# Patient Record
Sex: Female | Born: 1985
Health system: Southern US, Community
[De-identification: ages and names within clinical notes are randomized; demographics above are authoritative.]

## PROBLEM LIST (undated history)

## (undated) ENCOUNTER — Inpatient Hospital Stay (HOSPITAL_COMMUNITY): Payer: Self-pay

## (undated) DIAGNOSIS — E119 Type 2 diabetes mellitus without complications: Secondary | ICD-10-CM

## (undated) DIAGNOSIS — O24419 Gestational diabetes mellitus in pregnancy, unspecified control: Secondary | ICD-10-CM

## (undated) HISTORY — PX: TONSILLECTOMY: SUR1361

---

## 2015-10-26 ENCOUNTER — Ambulatory Visit (INDEPENDENT_AMBULATORY_CARE_PROVIDER_SITE_OTHER): Payer: Self-pay

## 2015-10-26 ENCOUNTER — Encounter: Payer: Self-pay | Admitting: Family Medicine

## 2015-10-26 ENCOUNTER — Inpatient Hospital Stay (HOSPITAL_COMMUNITY)
Admission: AD | Admit: 2015-10-26 | Discharge: 2015-10-26 | Disposition: A | Discharge status: Home / Self Care | Payer: Medicaid Other | Source: Ambulatory Visit | Attending: Family Medicine | Admitting: Family Medicine

## 2015-10-26 DIAGNOSIS — N912 Amenorrhea, unspecified: Secondary | ICD-10-CM | POA: Insufficient documentation

## 2015-10-26 DIAGNOSIS — Z3201 Encounter for pregnancy test, result positive: Secondary | ICD-10-CM

## 2015-10-26 DIAGNOSIS — Z3200 Encounter for pregnancy test, result unknown: Secondary | ICD-10-CM

## 2015-10-26 LAB — POCT PREGNANCY, URINE: Preg Test, Ur: POSITIVE — AB

## 2015-10-26 NOTE — Discharge Instructions (Signed)
Secondary Amenorrhea Secondary amenorrhea is the stopping of menstrual flow for 3-6 months in a female who has previously had periods. There are many possible causes. Most of these causes are not serious. Usually, treating the underlying problem causing the loss of menses will return your periods to normal. CAUSES  Some common and uncommon causes of not menstruating include:  Malnutrition.  Low blood sugar (hypoglycemia).  Polycystic ovary disease.  Stress or fear.  Breastfeeding.  Hormone imbalance.  Ovarian failure.  Medicines.  Extreme obesity.  Cystic fibrosis.  Low body weight or drastic weight reduction from any cause.  Early menopause.  Removal of ovaries or uterus.  Contraceptives.  Illness.  Long-term (chronic) illnesses.  Cushing syndrome.  Thyroid problems.  Birth control pills, patches, or vaginal rings for birth control. RISK FACTORS You may be at greater risk of secondary amenorrhea if:  You have a family history of this condition.  You have an eating disorder.  You do athletic training. DIAGNOSIS  A diagnosis is made by your health care provider taking a medical history and doing a physical exam. This will include a pelvic exam to check for problems with your reproductive organs. Pregnancy must be ruled out. Often, numerous blood tests are done to measure different hormones in the body. Urine testing may be done. Specialized exams (ultrasound, CT scan, MRI, or hysteroscopy) may have to be done as well as measuring the body mass index (BMI). TREATMENT  Treatment depends on the cause of the amenorrhea. If an eating disorder is present, this can be treated with an adequate diet and therapy. Chronic illnesses may improve with treatment of the illness. Amenorrhea may be corrected with medicines, lifestyle changes, or surgery. If the amenorrhea cannot be corrected, it is sometimes possible to create a false menstruation with medicines. HOME CARE  INSTRUCTIONS  Maintain a healthy diet.  Manage weight problems.  Exercise regularly but not excessively.  Get adequate sleep.  Manage stress.  Be aware of changes in your menstrual cycle. Keep a record of when your periods occur. Note the date your period starts, how long it lasts, and any problems. SEEK MEDICAL CARE IF: Your symptoms do not get better with treatment.   This information is not intended to replace advice given to you by your health care provider. Make sure you discuss any questions you have with your health care provider.   Document Released: 06/11/2006 Document Revised: 05/21/2014 Document Reviewed: 10/16/2012 Elsevier Interactive Patient Education 2016 Elsevier Inc.  

## 2015-10-26 NOTE — Progress Notes (Signed)
Pt presented to clinic today for pregnancy test. Test confirms she is pregnant around 9 weeks. Pt wishes to start prenatal care here at the clinic.

## 2015-10-26 NOTE — MAU Note (Signed)
Have last period the 10th of April.  Had + home test end of May.  Wants blood test.

## 2015-10-26 NOTE — MAU Provider Note (Signed)
[name] is a 30 y.o. fmale who presents to MAU today for pregnancy verification. The patient denies abdominal pain or vaginal bleeding today. LMP 08/22/15  BP 128/83 mmHg  Pulse 88  Temp(Src) 98.2 F (36.8 C) (Oral)  Resp 16  Wt 274 lb 12.8 oz (124.648 kg)  LMP 10/22/2015  CONSTITUTIONAL: Well-developed, well-nourished female in no acute distress.  CARDIOVASCULAR: Regular heart rate RESPIRATORY: Normal effort NEUROLOGICAL: Alert and oriented to person, place, and time.  SKIN: Skin is warm and dry. No rash noted. Not diaphoretic. No erythema. No pallor. PSYCH: Normal mood and affect. Normal behavior. Normal judgment and thought content.  MDM Medical Screen Exam Complete  A: Amenorrhea  P: Discharge from MAU Patient advised to follow-up with [facility] for pregnancy confirmation  Monday-Thursday 8am-4pm or Friday 8am-11am Patient may return to MAU as needed or if her condition were to change or worsen   Marny LowensteinJulie N Ronya Gilcrest, PA-C  10/26/2015 2:12 PM

## 2015-11-23 ENCOUNTER — Ambulatory Visit (INDEPENDENT_AMBULATORY_CARE_PROVIDER_SITE_OTHER): Payer: Medicaid Other | Admitting: Obstetrics & Gynecology

## 2015-11-23 ENCOUNTER — Encounter: Payer: Self-pay | Admitting: Obstetrics & Gynecology

## 2015-11-23 VITALS — BP 125/60 | HR 92 | Ht 68.0 in | Wt 273.2 lb

## 2015-11-23 DIAGNOSIS — Z124 Encounter for screening for malignant neoplasm of cervix: Secondary | ICD-10-CM | POA: Diagnosis not present

## 2015-11-23 DIAGNOSIS — O99210 Obesity complicating pregnancy, unspecified trimester: Secondary | ICD-10-CM

## 2015-11-23 DIAGNOSIS — O99211 Obesity complicating pregnancy, first trimester: Secondary | ICD-10-CM | POA: Diagnosis not present

## 2015-11-23 DIAGNOSIS — Z1151 Encounter for screening for human papillomavirus (HPV): Secondary | ICD-10-CM

## 2015-11-23 DIAGNOSIS — Z113 Encounter for screening for infections with a predominantly sexual mode of transmission: Secondary | ICD-10-CM | POA: Diagnosis not present

## 2015-11-23 DIAGNOSIS — Z3401 Encounter for supervision of normal first pregnancy, first trimester: Secondary | ICD-10-CM | POA: Diagnosis not present

## 2015-11-23 DIAGNOSIS — E669 Obesity, unspecified: Secondary | ICD-10-CM | POA: Diagnosis not present

## 2015-11-23 DIAGNOSIS — O0993 Supervision of high risk pregnancy, unspecified, third trimester: Secondary | ICD-10-CM | POA: Insufficient documentation

## 2015-11-23 LAB — POCT URINALYSIS DIP (DEVICE)
BILIRUBIN URINE: NEGATIVE
Glucose, UA: NEGATIVE mg/dL
Hgb urine dipstick: NEGATIVE
Ketones, ur: NEGATIVE mg/dL
LEUKOCYTES UA: NEGATIVE
NITRITE: NEGATIVE
PH: 5.5 (ref 5.0–8.0)
Protein, ur: NEGATIVE mg/dL
Specific Gravity, Urine: >=1.03 (ref 1.005–1.030)
Urobilinogen, UA: 0.2 mg/dL (ref 0.0–1.0)

## 2015-11-23 LAB — OB RESULTS CONSOLE GC/CHLAMYDIA: Gonorrhea: POSITIVE

## 2015-11-23 NOTE — Progress Notes (Signed)
   Subjective:    Cheryl Boyle is a 30 yo S [place] G1P0 6155w2d being seen today for her first obstetrical visit.  Her obstetrical history is significant for obesity. Patient does intend to breast feed. Pregnancy history fully reviewed.  Patient reports no complaints.  Filed Vitals:   11/23/15 1250 11/23/15 1251  BP: 125/60   Pulse: 92   Height:  5\' 8"  (1.727 m)  Weight: 273 lb 3.2 oz (123.923 kg)     HISTORY: OB History  Gravida Para Term Preterm AB SAB TAB Ectopic Multiple Living  1             # Outcome Date GA Lbr Len/2nd Weight Sex Delivery Anes PTL Lv  1 Current              History reviewed. No pertinent past medical history. History reviewed. No pertinent past surgical history. Family History  Problem Relation Age of Onset  . Diabetes Mother   . Heart disease Mother      Exam    Uterus:     Pelvic Exam:    Perineum: No Hemorrhoids   Vulva: normal   Vagina:  normal mucosa   pH:    Cervix: anteverted   Adnexa: no mass, fullness, tenderness   Bony Pelvis: android  System: Breast:  normal appearance, no masses or tenderness   Skin: normal coloration and turgor, no rashes    Neurologic: oriented   Extremities: normal strength, tone, and muscle mass   HEENT PERRLA   Mouth/Teeth mucous membranes moist, pharynx normal without lesions   Neck supple   Cardiovascular: regular rate and rhythm   Respiratory:  appears well, vitals normal, no respiratory distress, acyanotic, normal RR, ear and throat exam is normal, neck free of mass or lymphadenopathy, chest clear, no wheezing, crepitations, rhonchi, normal symmetric air entry   Abdomen: soft, non-tender; bowel sounds normal; no masses,  no organomegaly   Urinary: urethral meatus normal      Assessment:    Pregnancy: G1P0 Patient Active Problem List   Diagnosis Date Noted  . Obesity in pregnancy 11/23/2015        Plan:     Initial labs drawn. Prenatal vitamins. Problem list reviewed and  updated. Genetic Screening discussed Quad Screen:desired  Ultrasound discussed; fetal survey: ordered.  Follow up in 4 weeks. Discussed rec'd weight gain   Cheryl Boyle 11/23/2015

## 2015-11-24 ENCOUNTER — Encounter: Payer: Self-pay | Admitting: Obstetrics and Gynecology

## 2015-11-24 LAB — PRENATAL PROFILE (SOLSTAS)
ANTIBODY SCREEN: NEGATIVE
BASOS PCT: 0 %
Basophils Absolute: 0 cells/uL (ref 0–200)
EOS ABS: 152 {cells}/uL (ref 15–500)
EOS PCT: 2 %
HEMATOCRIT: 39.4 % (ref 35.0–45.0)
HEMOGLOBIN: 12.8 g/dL (ref 11.7–15.5)
HIV 1&2 Ab, 4th Generation: NONREACTIVE
Hepatitis B Surface Ag: NEGATIVE
Lymphocytes Relative: 27 %
Lymphs Abs: 2052 cells/uL (ref 850–3900)
MCH: 26.9 pg — AB (ref 27.0–33.0)
MCHC: 32.5 g/dL (ref 32.0–36.0)
MCV: 82.8 fL (ref 80.0–100.0)
MONOS PCT: 5 %
MPV: 10.4 fL (ref 7.5–12.5)
Monocytes Absolute: 380 cells/uL (ref 200–950)
NEUTROS ABS: 5016 {cells}/uL (ref 1500–7800)
Neutrophils Relative %: 66 %
Platelets: 265 10*3/uL (ref 140–400)
RBC: 4.76 MIL/uL (ref 3.80–5.10)
RDW: 15 % (ref 11.0–15.0)
Rh Type: POSITIVE
Rubella: 2.55 Index — ABNORMAL HIGH (ref <0.90)
WBC: 7.6 10*3/uL (ref 3.8–10.8)

## 2015-11-24 LAB — GC/CHLAMYDIA PROBE AMP (~~LOC~~) NOT AT ARMC
Chlamydia: NEGATIVE
NEISSERIA GONORRHEA: POSITIVE — AB

## 2015-11-24 LAB — GLUCOSE TOLERANCE, 1 HOUR (50G) W/O FASTING: Glucose, 1 Hr, gestational: 175 mg/dL — ABNORMAL HIGH (ref <140)

## 2015-11-24 LAB — CYTOLOGY - PAP

## 2015-11-25 ENCOUNTER — Inpatient Hospital Stay (HOSPITAL_COMMUNITY)
Admission: AD | Admit: 2015-11-25 | Discharge: 2015-11-25 | Disposition: A | Discharge status: Home / Self Care | Payer: Medicaid Other | Source: Ambulatory Visit | Attending: Obstetrics & Gynecology | Admitting: Obstetrics & Gynecology

## 2015-11-25 ENCOUNTER — Telehealth: Payer: Self-pay

## 2015-11-25 ENCOUNTER — Ambulatory Visit: Payer: Self-pay

## 2015-11-25 ENCOUNTER — Encounter: Payer: Self-pay | Admitting: Obstetrics & Gynecology

## 2015-11-25 DIAGNOSIS — Z3A13 13 weeks gestation of pregnancy: Secondary | ICD-10-CM | POA: Insufficient documentation

## 2015-11-25 DIAGNOSIS — O98213 Gonorrhea complicating pregnancy, third trimester: Secondary | ICD-10-CM | POA: Insufficient documentation

## 2015-11-25 DIAGNOSIS — O98211 Gonorrhea complicating pregnancy, first trimester: Secondary | ICD-10-CM | POA: Diagnosis present

## 2015-11-25 DIAGNOSIS — O24919 Unspecified diabetes mellitus in pregnancy, unspecified trimester: Secondary | ICD-10-CM | POA: Insufficient documentation

## 2015-11-25 LAB — PAIN MGMT, PROFILE 6 CONF W/O MM, U
6 ACETYLMORPHINE: NEGATIVE ng/mL (ref <10)
ALCOHOL METABOLITES: NEGATIVE ng/mL (ref <500)
Amphetamines: NEGATIVE ng/mL (ref <500)
BARBITURATES: NEGATIVE ng/mL (ref <300)
Benzodiazepines: NEGATIVE ng/mL (ref <100)
Cocaine Metabolite: NEGATIVE ng/mL (ref <150)
Creatinine: 212.4 mg/dL (ref >=20.0)
MARIJUANA METABOLITE: NEGATIVE ng/mL (ref <20)
METHADONE METABOLITE: NEGATIVE ng/mL (ref <100)
OXYCODONE: NEGATIVE ng/mL (ref <100)
Opiates: NEGATIVE ng/mL (ref <100)
Oxidant: NEGATIVE ug/mL (ref <200)
PHENCYCLIDINE: NEGATIVE ng/mL (ref <25)
Please note:: 0
pH: 5.8 (ref 4.5–9.0)

## 2015-11-25 LAB — HEMOGLOBINOPATHY EVALUATION
HCT: 39.4 % (ref 35.0–45.0)
HGB A2 QUANT: 2.2 % (ref 1.8–3.5)
Hemoglobin: 12.8 g/dL (ref 11.7–15.5)
Hgb A: 96.8 % (ref >96.0)
MCH: 26.9 pg — ABNORMAL LOW (ref 27.0–33.0)
MCV: 82.8 fL (ref 80.0–100.0)
RBC: 4.76 MIL/uL (ref 3.80–5.10)
RDW: 15 % (ref 11.0–15.0)

## 2015-11-25 LAB — CULTURE, OB URINE
Colony Count: NO GROWTH
Organism ID, Bacteria: NO GROWTH

## 2015-11-25 MED ORDER — CEFTRIAXONE SODIUM 250 MG IJ SOLR
250.0000 mg | Freq: Once | INTRAMUSCULAR | Status: AC
  Administered 2015-11-25: 250 mg via INTRAMUSCULAR
  Filled 2015-11-25: qty 250

## 2015-11-25 MED ORDER — AZITHROMYCIN 250 MG PO TABS
1000.0000 mg | ORAL_TABLET | Freq: Once | ORAL | Status: AC
  Administered 2015-11-25: 1000 mg via ORAL
  Filled 2015-11-25: qty 4

## 2015-11-25 MED ORDER — AZITHROMYCIN 250 MG PO TABS: 500.0000 mg | ORAL_TABLET | Freq: Once | ORAL | Status: DC

## 2015-11-25 NOTE — MAU Note (Signed)
Was called and instructed to go to clinic for treatment for gonorrhea. Clinic is now closed.  Spoke with provider, will treat pt. Pt has questions regarding infection.  Dr Omer JackMumaw in with pt.

## 2015-11-25 NOTE — MAU Provider Note (Signed)
30 y/o G1P0 at 13.4 weeks, here for +gonorrhea test. Patient came in to MAU due to a +gonorrhea that was recently tested in clinic, but patient was unable to make it to clinic before closing, needing treatment. Patient brought in partner, Zouheir Hamze DOB 06/23/1973, who agreed to be treated via expedited partner therapy (information given to partner), no known drug allergies. Questions were answered to patient and partner's satisfaction. It was recommended that both partners do not resume intercourse for at least 1 week after treatment for both, but highly recommended for abstinence for at least 4-6 weeks until treatment of cure can be performed on both.   Jen MowElizabeth Zerenity Bowron, DO, [place] Fellow

## 2015-11-25 NOTE — Telephone Encounter (Signed)
Error

## 2015-11-25 NOTE — Telephone Encounter (Signed)
Patient tested positive for gonorrhea infection.Patient is aware and will come into clinic today for treatment. Patient advised to tell all partners and to have no sex for least 2 weeks. STD card completed and fax to health department.

## 2015-11-25 NOTE — MAU Note (Signed)
Partner is here, rx for treatment given. Advised to abstain from sexual relations for minimum of 1 wk, would prefer 4 wks to recheck.

## 2015-11-28 ENCOUNTER — Encounter: Payer: Self-pay | Admitting: Obstetrics and Gynecology

## 2015-11-28 ENCOUNTER — Telehealth: Payer: Self-pay

## 2015-11-28 NOTE — Telephone Encounter (Signed)
She will need a 3 hour GTT soon please.   I have left a message for patient to call us back regarding lab results.

## 2015-11-29 NOTE — Telephone Encounter (Signed)
Patient has been scheduled tomorrow for 3 hour gtt

## 2015-12-02 ENCOUNTER — Other Ambulatory Visit: Payer: Self-pay

## 2015-12-02 DIAGNOSIS — O99810 Abnormal glucose complicating pregnancy: Secondary | ICD-10-CM

## 2015-12-03 ENCOUNTER — Encounter: Payer: Self-pay | Admitting: Obstetrics & Gynecology

## 2015-12-03 LAB — GLUCOSE TOLERANCE, 3 HOURS
GLUCOSE 3 HOUR GTT: 180 mg/dL — AB (ref <145)
GLUCOSE, 1 HOUR-GESTATIONAL: 236 mg/dL — AB (ref <190)
GLUCOSE, 2 HOUR-GESTATIONAL: 206 mg/dL — AB (ref <165)
GLUCOSE, FASTING-GESTATIONAL: 124 mg/dL — AB (ref 65–104)

## 2015-12-08 ENCOUNTER — Telehealth: Payer: Self-pay | Admitting: General Practice

## 2015-12-08 NOTE — Telephone Encounter (Signed)
Per Dr Macon Large, patient's 3 hr gtt was elevated. Patient probably had diabetes before pregnancy. Patient needs to come in for diabetes education & for supplies. Called patient with [employer] [phone], no answer- left message to call us back at the office for results

## 2015-12-14 NOTE — Telephone Encounter (Signed)
I called Cheryl Boyle with [facility] interpreter [phone].  I notified her that her 3 hr gtt was elevated which means she probably had diabetes before pregnancy. I offered her appt to come into see Maggie , nurse educator on 12/19/15 at 0830 which she agreed to.

## 2015-12-19 ENCOUNTER — Ambulatory Visit: Payer: Medicaid Other | Admitting: *Deleted

## 2015-12-19 ENCOUNTER — Encounter: Payer: Medicaid Other | Attending: Obstetrics and Gynecology | Admitting: Dietician

## 2015-12-19 DIAGNOSIS — O24419 Gestational diabetes mellitus in pregnancy, unspecified control: Secondary | ICD-10-CM

## 2015-12-19 DIAGNOSIS — Z713 Dietary counseling and surveillance: Secondary | ICD-10-CM | POA: Insufficient documentation

## 2015-12-19 NOTE — Progress Notes (Signed)
Diabetes Education: 12/19/15 Cheryl Boyle is a 30 year old G1P0 at 2317 weeks. Her mother has type 2 DM. Speaks [place] and [place]. Agreed to do education session in English spoken slowly.  During session questioned and ask for clarification throughout. Completed review of GDM and its implications for baby and mother.   Review of post-delivery self-care measures to assist in preventing the onset of type 2 DM. Review of factors that increase/decrease blood glucose. Encouraged her to walk daily for 30 minutes in the cooler part of the day. Review of blood glucose monitoring procedure.  Currently has no insurance and has just applied for [place] Medicaid.  Reports that they informed her that they wold call her in 3 weeks to let her know of the status of her application.  I took the liberty of providing a True Track meter and strips and lancets to get her covered over the next 3-4 weeks as she awaits Medicaid. Meter: lot: [id number]  EXP: 2017/08/25 Strips: LOT: [id number]  EXP 2017/09/10 Lancets: LOT [id number] NM EXP 2020/01/17 Instructed to monitor fasting and 2 hours after first bite of each meal.  Glucose goals noted. On return demonstration of self-monitoring, her glucose post-breakfast was 150 mg/dl. Instructed to record glucose levels and to bring meter and glucose log to all clinic appointments. Completed review of the carb restricted diet for GDM and the recommended meal and snack patterns. Review based on foods she typically will eat. Provided handout "Nutrition, Diabetes and Pregnancy" in [place] and [place]. Will follow as needed. Maggie Moses Odoherty, RN, RD, LDN

## 2015-12-26 ENCOUNTER — Ambulatory Visit (INDEPENDENT_AMBULATORY_CARE_PROVIDER_SITE_OTHER): Payer: Medicaid Other | Admitting: Obstetrics and Gynecology

## 2015-12-26 VITALS — BP 124/68 | HR 94 | Wt 276.0 lb

## 2015-12-26 DIAGNOSIS — O98212 Gonorrhea complicating pregnancy, second trimester: Secondary | ICD-10-CM | POA: Diagnosis not present

## 2015-12-26 DIAGNOSIS — O99212 Obesity complicating pregnancy, second trimester: Secondary | ICD-10-CM | POA: Diagnosis not present

## 2015-12-26 DIAGNOSIS — O24312 Unspecified pre-existing diabetes mellitus in pregnancy, second trimester: Secondary | ICD-10-CM | POA: Diagnosis present

## 2015-12-26 DIAGNOSIS — E669 Obesity, unspecified: Secondary | ICD-10-CM

## 2015-12-26 DIAGNOSIS — O0992 Supervision of high risk pregnancy, unspecified, second trimester: Secondary | ICD-10-CM

## 2015-12-26 DIAGNOSIS — O24912 Unspecified diabetes mellitus in pregnancy, second trimester: Secondary | ICD-10-CM

## 2015-12-26 DIAGNOSIS — O99210 Obesity complicating pregnancy, unspecified trimester: Secondary | ICD-10-CM

## 2015-12-26 DIAGNOSIS — O98219 Gonorrhea complicating pregnancy, unspecified trimester: Secondary | ICD-10-CM

## 2015-12-26 LAB — POCT URINALYSIS DIP (DEVICE)
Bilirubin Urine: NEGATIVE
GLUCOSE, UA: NEGATIVE mg/dL
Hgb urine dipstick: NEGATIVE
KETONES UR: NEGATIVE mg/dL
Nitrite: NEGATIVE
PROTEIN: 30 mg/dL — AB
Urobilinogen, UA: 1 mg/dL (ref 0.0–1.0)
pH: 6 (ref 5.0–8.0)

## 2015-12-26 NOTE — Progress Notes (Signed)
Subjective:  Cheryl Boyle is a 30 y.o. G1P0 at 5648w0d being seen today for ongoing prenatal care.  She is currently monitored for the following issues for this high-risk pregnancy and has Obesity in pregnancy; Supervision of high risk pregnancy, antepartum; Gonorrhea affecting pregnancy; and Diabetes mellitus during pregnancy, antepartum on her problem list.  Patient reports no complaints.  Contractions: Not present. Vag. Bleeding: None.  Movement: Absent. Denies leaking of fluid.   The following portions of the patient's history were reviewed and updated as appropriate: allergies, current medications, past family history, past medical history, past social history, past surgical history and problem list. Problem list updated.  Objective:   Vitals:   12/26/15 0850  BP: 124/68  Pulse: 94  Weight: 276 lb (125.2 kg)    Fetal Status: Fetal Heart Rate (bpm): 156   Movement: Absent     General:  Alert, oriented and cooperative. Patient is in no acute distress.  Skin: Skin is warm and dry. No rash noted.   Cardiovascular: Normal heart rate noted  Respiratory: Normal respiratory effort, no problems with respiration noted  Abdomen: Soft, gravid, appropriate for gestational age. Pain/Pressure: Present     Pelvic:  Cervical exam deferred        Extremities: Normal range of motion.  Edema: None  Mental Status: Normal mood and affect. Normal behavior. Normal judgment and thought content.   Urinalysis: Urine Protein: 1+ Urine Glucose: Negative  Assessment and Plan:  Pregnancy: G1P0 at 5548w0d  1. Diabetes mellitus affecting pregnancy in second trimester Patient did not bring her CBG log. She reports fasting are in the low 100 and pp in 130's Advised to monitor and adjust diet. Advised to increase exercise activity Stressed the importance of brining meter and log book in order to assist her in optimizing the health of her pregnancy - AFP, Quad Screen  2. Supervision of high risk pregnancy,  antepartum, second trimester Quad screen today Anatomy ultrasound scheduled for next week   5. Diabetes mellitus during pregnancy, antepartum, second trimester   General obstetric precautions including but not limited to vaginal bleeding, contractions, leaking of fluid and fetal movement were reviewed in detail with the patient. Please refer to After Visit Summary for other counseling recommendations.  Return in about 2 weeks (around 01/09/2016).   Catalina AntiguaPeggy Darrall Strey, MD

## 2015-12-27 LAB — AFP, QUAD SCREEN
AFP: 24.6 ng/mL
Age Alone: 1:496 {titer}
CURR GEST AGE: 18 wk
Down Syndrome Scr Risk Est: 1:8970 {titer}
HCG, Total: 9.08 IU/mL
INH: 104.8 pg/mL
INTERPRETATION-AFP: NEGATIVE
MOM FOR HCG: 0.46
MOM FOR INH: 0.86
MoM for AFP: 0.79
Open Spina bifida: NEGATIVE
Osb Risk: <1:54600 {titer}
Tri 18 Scr Risk Est: NEGATIVE
uE3 Mom: 1.32
uE3 Value: 1.44 ng/mL

## 2016-01-02 ENCOUNTER — Encounter: Payer: Self-pay | Admitting: Obstetrics and Gynecology

## 2016-01-10 ENCOUNTER — Other Ambulatory Visit: Payer: Self-pay | Admitting: Obstetrics & Gynecology

## 2016-01-10 ENCOUNTER — Ambulatory Visit (HOSPITAL_COMMUNITY)
Admission: RE | Admit: 2016-01-10 | Discharge: 2016-01-10 | Disposition: A | Discharge status: Home / Self Care | Payer: Medicaid Other | Source: Ambulatory Visit | Attending: Obstetrics & Gynecology | Admitting: Obstetrics & Gynecology

## 2016-01-10 DIAGNOSIS — Z3A20 20 weeks gestation of pregnancy: Secondary | ICD-10-CM

## 2016-01-10 DIAGNOSIS — Z36 Encounter for antenatal screening of mother: Secondary | ICD-10-CM | POA: Diagnosis present

## 2016-01-10 DIAGNOSIS — Z1389 Encounter for screening for other disorder: Secondary | ICD-10-CM

## 2016-01-10 DIAGNOSIS — Z3401 Encounter for supervision of normal first pregnancy, first trimester: Secondary | ICD-10-CM

## 2016-01-20 ENCOUNTER — Telehealth: Payer: Self-pay | Admitting: General Practice

## 2016-01-20 NOTE — Telephone Encounter (Signed)
Patient called and left message stating she had an ultrasound done on the 29th and she would like her results. Called patient, no answer- left message stating we are trying to reach to return your phone call, please call us back at the clinics. Per chart review, patient needs OB appt and hasn't been seen in a couple weeks.  

## 2016-01-20 NOTE — Telephone Encounter (Signed)
Patient called and left message stating she had an ultrasound done on the 29th and she would like her results. Called patient, no answer- left message stating we are trying to reach to return your phone call, please call us back at the clinics. Per chart review, patient needs OB appt and hasn't been seen in a couple weeks.

## 2016-01-23 ENCOUNTER — Encounter: Payer: Medicaid Other | Admitting: Obstetrics and Gynecology

## 2016-01-23 NOTE — Telephone Encounter (Signed)
Patient has appointment this am will address her concerns at this time.

## 2016-02-28 ENCOUNTER — Telehealth: Payer: Self-pay | Admitting: Obstetrics & Gynecology

## 2016-02-28 NOTE — Telephone Encounter (Signed)
Called patient with [employer] to try to reschedule appointment. She was not home, so we left a message to call us back to reschedule.

## 2016-03-19 ENCOUNTER — Telehealth: Payer: Self-pay | Admitting: General Practice

## 2016-03-19 NOTE — Telephone Encounter (Signed)
Patient called and left message stating she hasn't seen the doctor in a while and would like her u/s results. Called patient and informed her of normal u/s results but need for follow up u/s for growth which we will schedule at her appt this week on 11/8. Patient verbalized understanding & had no questions

## 2016-03-21 ENCOUNTER — Ambulatory Visit (INDEPENDENT_AMBULATORY_CARE_PROVIDER_SITE_OTHER): Payer: Medicaid Other | Admitting: Family

## 2016-03-21 ENCOUNTER — Other Ambulatory Visit (HOSPITAL_COMMUNITY)
Admission: RE | Admit: 2016-03-21 | Discharge: 2016-03-21 | Disposition: A | Discharge status: Home / Self Care | Payer: Medicaid Other | Source: Ambulatory Visit | Attending: Family | Admitting: Family

## 2016-03-21 VITALS — BP 118/73 | HR 118 | Wt 278.3 lb

## 2016-03-21 DIAGNOSIS — Z113 Encounter for screening for infections with a predominantly sexual mode of transmission: Secondary | ICD-10-CM | POA: Insufficient documentation

## 2016-03-21 DIAGNOSIS — O98213 Gonorrhea complicating pregnancy, third trimester: Secondary | ICD-10-CM

## 2016-03-21 DIAGNOSIS — O24119 Pre-existing diabetes mellitus, type 2, in pregnancy, unspecified trimester: Secondary | ICD-10-CM

## 2016-03-21 DIAGNOSIS — O0990 Supervision of high risk pregnancy, unspecified, unspecified trimester: Secondary | ICD-10-CM

## 2016-03-21 DIAGNOSIS — Z23 Encounter for immunization: Secondary | ICD-10-CM

## 2016-03-21 DIAGNOSIS — O24913 Unspecified diabetes mellitus in pregnancy, third trimester: Secondary | ICD-10-CM | POA: Diagnosis present

## 2016-03-21 DIAGNOSIS — O0993 Supervision of high risk pregnancy, unspecified, third trimester: Secondary | ICD-10-CM

## 2016-03-21 LAB — POCT URINALYSIS DIP (DEVICE)
GLUCOSE, UA: NEGATIVE mg/dL
Glucose, UA: NEGATIVE mg/dL
Hgb urine dipstick: NEGATIVE
Hgb urine dipstick: NEGATIVE
KETONES UR: NEGATIVE mg/dL
Nitrite: NEGATIVE
Nitrite: NEGATIVE
PROTEIN: NEGATIVE mg/dL
Protein, ur: NEGATIVE mg/dL
SPECIFIC GRAVITY, URINE: 1.025 (ref 1.005–1.030)
Specific Gravity, Urine: 1.025 (ref 1.005–1.030)
UROBILINOGEN UA: 0.2 mg/dL (ref 0.0–1.0)
Urobilinogen, UA: 0.2 mg/dL (ref 0.0–1.0)
pH: 5.5 (ref 5.0–8.0)
pH: 5.5 (ref 5.0–8.0)

## 2016-03-21 LAB — COMPREHENSIVE METABOLIC PANEL
ALBUMIN: 3.4 g/dL — AB (ref 3.6–5.1)
ALT: 8 U/L (ref 6–29)
AST: 11 U/L (ref 10–30)
Alkaline Phosphatase: 114 U/L (ref 33–115)
BUN: 6 mg/dL — ABNORMAL LOW (ref 7–25)
CALCIUM: 9.2 mg/dL (ref 8.6–10.2)
CHLORIDE: 107 mmol/L (ref 98–110)
CO2: 19 mmol/L — AB (ref 20–31)
Creat: 0.51 mg/dL (ref 0.50–1.10)
Glucose, Bld: 138 mg/dL — ABNORMAL HIGH (ref 65–99)
POTASSIUM: 4.3 mmol/L (ref 3.5–5.3)
Sodium: 137 mmol/L (ref 135–146)
Total Bilirubin: 0.2 mg/dL (ref 0.2–1.2)
Total Protein: 6.1 g/dL (ref 6.1–8.1)

## 2016-03-21 LAB — TSH: TSH: 1.65 mIU/L

## 2016-03-21 LAB — CBC
HEMATOCRIT: 34.8 % — AB (ref 35.0–45.0)
Hemoglobin: 11.5 g/dL — ABNORMAL LOW (ref 11.7–15.5)
MCH: 27.7 pg (ref 27.0–33.0)
MCHC: 33 g/dL (ref 32.0–36.0)
MCV: 83.9 fL (ref 80.0–100.0)
MPV: 10.7 fL (ref 7.5–12.5)
Platelets: 237 10*3/uL (ref 140–400)
RBC: 4.15 MIL/uL (ref 3.80–5.10)
RDW: 14 % (ref 11.0–15.0)
WBC: 8.8 10*3/uL (ref 3.8–10.8)

## 2016-03-21 LAB — GLUCOSE, CAPILLARY: GLUCOSE-CAPILLARY: 142 mg/dL — AB (ref 65–99)

## 2016-03-21 LAB — OB RESULTS CONSOLE GC/CHLAMYDIA: Gonorrhea: NEGATIVE

## 2016-03-21 MED ORDER — TETANUS-DIPHTH-ACELL PERTUSSIS 5-2.5-18.5 LF-MCG/0.5 IM SUSP
0.5000 mL | Freq: Once | INTRAMUSCULAR | Status: AC
  Administered 2016-03-21: 0.5 mL via INTRAMUSCULAR

## 2016-03-21 MED ORDER — ASPIRIN 81 MG PO CHEW: 81.0000 mg | CHEWABLE_TABLET | Freq: Every day | ORAL | 2 refills | Status: DC

## 2016-03-21 NOTE — Patient Instructions (Addendum)
Check blood glucose four times a day;  First thing in the morning, before eating; 2 hours after breakfast, lunch and dinner.  Fasting blood sugar should be under 90;  All other blood glucose should be under 120 two hours after meals.     AREA PEDIATRIC/FAMILY PRACTICE PHYSICIANS  [facility] [address], [address] [place], [place]  [address] Phone - [phone]   Fax - [phone]  [facility] [address] [place], [place] [address] Phone - [phone]   Fax - [phone]  [facility] [address] [place], [place]  [address] Phone - [phone]   Fax - [phone]  [facility] [address] [place], [place]  [address] Phone - [phone]   Fax - [phone]  [facility] [address] [place], [place]  [address] Phone - [phone]   Fax - [phone]  [facility] [address], [address] [place], [place]  [address] Phone - [phone]   Fax - [phone]  [facility] [place] [address], [address] [place], [place]  [address] Phone - [phone]   Fax - [phone]  [facility] [address] [place], [place]  [address] Phone - [phone]   Fax - [phone]  [facility] [address] [place], [place]  [address] Phone - [phone]   Fax - [phone] [facility] [address] [place], [place]  [address] Phone - [phone]   Fax - [phone]  [facility] [address] [place], [place]  [address] Phone - [phone]   Fax - [phone]  [facility] [address], [address] [place], [place]  [address] Phone - [phone]   Fax - [phone]  [facility] [address], [address] [place], [place]  [address] Phone - [phone]   Fax - [phone]  [facility] [address], [address] [place], [place]  [address] Phone - [phone]  [facility] [address] [place], [place]  [address] Phone - [phone]   Fax - [phone]  [facility] [address], [address] [place], [place]  [address] Phone - [phone]   Fax - [phone]  [facility] [address] [place], [place]  [address] Phone - [phone]   Fax - [phone]  [facility] [address] [place], [place]  [address] Phone - [phone]   Fax - [phone]   [facility] [address] [place], [place]  [address] Phone - [phone]   Fax - [phone]  [facility] [address], [address] [place], [place]  [address] Phone - [phone]   Fax - [phone]  [facility] [address], [address] [place], [place]  [address] Phone - [phone]   Fax - [phone]  [facility] [address] [place], [place]  [address] Phone - [phone]   Fax - [phone]  [facility] [address] [place], [place]  [address] Phone - [phone]   Fax - [phone] [facility] [address] [place], [place]  [address] Phone - [phone]   Fax - [phone]  [facility] [address] [place], [place]  [address] Phone - [phone]   Fax - [phone]  [facility] [place] [address], [address]  [place], [place]  [address] Phone - [phone]   Fax - [phone]   CIRCUMCISION  Circumcision is considered an elective/non-medically necessary procedure. There are many reasons parents decide to have their sons circumsized. During the first year of life circumcised males have a reduced risk of urinary tract infections but after this year the rates between circumcised males and uncircumcised males are the same.  It is safe to have your son circumcised outside  of the hospital and the places above perform them regularly.    Places to have your son circumcised:    [facility] [phone] $480 by 4 wks  [facility] [phone] $244 by 4 wks  [facility] [phone] $175 by 2 wks  [facility] [phone] $250 by 7 days [facility] [phone] $150 by 4 wks  These prices sometimes change but are roughly what you can expect to pay. Please call and confirm pricing.    Safe Medications in Pregnancy   Acne: Benzoyl Peroxide Salicylic Acid  Backache/Headache: Tylenol: 2 regular strength every 4 hours OR              2 Extra strength every 6 hours  Colds/Coughs/Allergies: Benadryl (alcohol free) 25 mg every 6 hours as needed Breath right strips Claritin Cepacol throat lozenges Chloraseptic throat spray Cold-Eeze- up to three times per day Cough drops, alcohol free Flonase (by prescription only) Guaifenesin Mucinex Robitussin DM (plain only, alcohol free) Saline nasal spray/drops Sudafed (pseudoephedrine) & Actifed ** use only after [redacted] weeks gestation and if you do not have high blood pressure Tylenol Vicks Vaporub Zinc lozenges Zyrtec   Constipation: Colace Ducolax suppositories Fleet enema Glycerin suppositories Metamucil Milk of magnesia Miralax Senokot Smooth move tea  Diarrhea: Kaopectate Imodium A-D  *NO pepto Bismol  Hemorrhoids: Anusol Anusol HC Preparation H Tucks  Indigestion: Tums Maalox Mylanta Zantac  Pepcid  Insomnia: Benadryl (alcohol free) 25mg  every 6 hours as needed Tylenol PM Unisom, no Gelcaps  Leg Cramps: Tums MagGel  Nausea/Vomiting:  Bonine Dramamine Emetrol Ginger extract Sea bands Meclizine  Nausea medication to take during pregnancy:  Unisom (doxylamine succinate 25 mg tablets) Take one tablet daily at bedtime.  If symptoms are not adequately controlled, the dose can be increased to a maximum recommended dose of two tablets daily (1/2 tablet in the morning, 1/2 tablet mid-afternoon and one at bedtime). Vitamin B6 100mg  tablets. Take one tablet twice a day (up to 200 mg per day).  Skin Rashes: Aveeno products Benadryl cream or 25mg  every 6 hours as needed Calamine Lotion 1% cortisone cream  Yeast infection: Gyne-lotrimin 7 Monistat 7   **If taking multiple medications, please check labels to avoid duplicating the same active ingredients **take medication as directed on the label ** Do not exceed 4000 mg of tylenol in 24 hours **Do not take medications that contain aspirin or ibuprofen

## 2016-03-21 NOTE — Progress Notes (Signed)
28 wk labs today 28 wk packet given tdap vaccine given

## 2016-03-21 NOTE — Progress Notes (Signed)
   PRENATAL VISIT NOTE  Subjective:  Cheryl Boyle is a 30 y.o. G1P0 at 54w2dbeing seen today for ongoing prenatal care.  She is currently monitored for the following issues for this high-risk pregnancy and has Obesity in pregnancy; Pregnancy, supervision, high-risk, third trimester; Gonorrhea in pregnancy, antepartum, third trimester; and Diabetes mellitus during pregnancy, antepartum on her problem list.  Patient reports not coming to appoitments due to not having any scheduled.  Verbalizes concern regarding not seeing a provider.  Not checking blood glucose due to not having appointments.   Contractions: Not present. Vag. Bleeding: None.  Movement: Present. Denies leaking of fluid.   The following portions of the patient's history were reviewed and updated as appropriate: allergies, current medications, past family history, past medical history, past social history, past surgical history and problem list. Problem list updated.  Objective:   Vitals:   03/21/16 0843  BP: 118/73  Pulse: (!) 118  Weight: 278 lb 4.8 oz (126.2 kg)   CBG (last 3)   Recent Labs  03/21/16 0904  GLUCAP 142*    Fetal Status: Fetal Heart Rate (bpm): 155 Fundal Height: 31 cm Movement: Present     General:  Alert, oriented and cooperative. Patient is in no acute distress.  Skin: Skin is warm and dry. No rash noted.   Cardiovascular: Normal heart rate noted  Respiratory: Normal respiratory effort, no problems with respiration noted  Abdomen: Soft, gravid, appropriate for gestational age. Pain/Pressure: Absent     Pelvic:  Cervical exam deferred        Extremities: Normal range of motion.  Edema: None  Mental Status: Normal mood and affect. Normal behavior. Normal judgment and thought content.   Assessment and Plan:  Pregnancy: G1P0 at 359w2d1. Pregnancy, supervision, high-risk, third trimester - [facility]lam[facility] (with reflex) - CBC - [facility]  2. Diabetes  mellitus affecting pregnancy in third trimester - [place] OB FOLLOW UP; Future > growth - Comp Met (CMET) - Protein / creatinine ratio, urine - TSH - [place] Echocardiography; Future - aspirin 81 MG chewable tablet; Chew 1 tablet (81 mg total) by mouth daily.  Dispense: 30 tablet; Refill: 2 - Education:  Reviewed in detail the importance of keeping all scheduled appointments and monitoring CBG's; explained consequences of elevated blood glucose, including macrosomia and fetal death.  Also reviewed various management options including PO and insulin and fetal monitoring beginning at 32 weeks.    3. Gonorrhea in pregnancy, antepartum, third trimester - GC/Chlamydia Probe Amp > TOC   Preterm labor symptoms and general obstetric precautions including but not limited to vaginal bleeding, contractions, leaking of fluid and fetal movement were reviewed in detail with the patient. Please refer to After Visit Summary for other counseling recommendations.  Return in about 1 week (around 03/28/2016).   WaVenia Carbon Michiel CowboyCNM

## 2016-03-22 LAB — HIV ANTIBODY (ROUTINE TESTING W REFLEX): HIV 1&2 Ab, 4th Generation: NONREACTIVE

## 2016-03-22 LAB — PROTEIN / CREATININE RATIO, URINE
Creatinine, Urine: 202 mg/dL (ref 20–320)
PROTEIN CREATININE RATIO: 109 mg/g{creat} (ref 21–161)
Total Protein, Urine: 22 mg/dL (ref 5–24)

## 2016-03-22 LAB — GC/CHLAMYDIA PROBE AMP (~~LOC~~) NOT AT ARMC
CHLAMYDIA, DNA PROBE: NEGATIVE
NEISSERIA GONORRHEA: NEGATIVE

## 2016-03-22 LAB — RPR

## 2016-03-26 ENCOUNTER — Ambulatory Visit (HOSPITAL_COMMUNITY)
Admission: RE | Admit: 2016-03-26 | Discharge: 2016-03-26 | Disposition: A | Discharge status: Home / Self Care | Payer: Medicaid Other | Source: Ambulatory Visit | Attending: Family | Admitting: Family

## 2016-03-26 ENCOUNTER — Other Ambulatory Visit: Payer: Self-pay | Admitting: Family

## 2016-03-26 DIAGNOSIS — O99213 Obesity complicating pregnancy, third trimester: Secondary | ICD-10-CM

## 2016-03-26 DIAGNOSIS — O24913 Unspecified diabetes mellitus in pregnancy, third trimester: Secondary | ICD-10-CM

## 2016-03-26 DIAGNOSIS — Z3A31 31 weeks gestation of pregnancy: Secondary | ICD-10-CM | POA: Insufficient documentation

## 2016-03-26 DIAGNOSIS — Z362 Encounter for other antenatal screening follow-up: Secondary | ICD-10-CM | POA: Insufficient documentation

## 2016-04-02 ENCOUNTER — Ambulatory Visit (INDEPENDENT_AMBULATORY_CARE_PROVIDER_SITE_OTHER): Payer: Medicaid Other | Admitting: Obstetrics & Gynecology

## 2016-04-02 VITALS — BP 117/78 | HR 110 | Wt 280.0 lb

## 2016-04-02 DIAGNOSIS — O0993 Supervision of high risk pregnancy, unspecified, third trimester: Secondary | ICD-10-CM

## 2016-04-02 DIAGNOSIS — O24415 Gestational diabetes mellitus in pregnancy, controlled by oral hypoglycemic drugs: Secondary | ICD-10-CM

## 2016-04-02 LAB — POCT URINALYSIS DIP (DEVICE)
BILIRUBIN URINE: NEGATIVE
Glucose, UA: 500 mg/dL — AB
HGB URINE DIPSTICK: NEGATIVE
Nitrite: NEGATIVE
PH: 6.5 (ref 5.0–8.0)
PROTEIN: 30 mg/dL — AB
SPECIFIC GRAVITY, URINE: 1.025 (ref 1.005–1.030)
Urobilinogen, UA: 1 mg/dL (ref 0.0–1.0)

## 2016-04-02 MED ORDER — GLYBURIDE 2.5 MG PO TABS: 2.5000 mg | ORAL_TABLET | Freq: Two times a day (BID) | ORAL | 3 refills | Status: DC

## 2016-04-02 NOTE — Progress Notes (Signed)
US 11/13 reviewed    PRENATAL VISIT NOTE  Subjective:  Cheryl Boyle is a 30 y.o. G1P0 at 8016w0d being seen today for ongoing prenatal care.  She is currently monitored for the following issues for this high-risk pregnancy and has Obesity in pregnancy; Pregnancy, supervision, high-risk, third trimester; Gonorrhea in pregnancy, antepartum, third trimester; and Diabetes mellitus during pregnancy, antepartum on her problem list.  Patient reports no complaints.  Contractions: Not present. Vag. Bleeding: None.  Movement: Present. Denies leaking of fluid.   The following portions of the patient's history were reviewed and updated as appropriate: allergies, current medications, past family history, past medical history, past social history, past surgical history and problem list. Problem list updated.  Objective:   Vitals:   04/02/16 1550  BP: 117/78  Pulse: (!) 110  Weight: 280 lb (127 kg)    Fetal Status: Fetal Heart Rate (bpm): 157   Movement: Present     General:  Alert, oriented and cooperative. Patient is in no acute distress.  Skin: Skin is warm and dry. No rash noted.   Cardiovascular: Normal heart rate noted  Respiratory: Normal respiratory effort, no problems with respiration noted  Abdomen: Soft, gravid, appropriate for gestational age. Pain/Pressure: Present     Pelvic:  Cervical exam deferred        Extremities: Normal range of motion.  Edema: None  Mental Status: Normal mood and affect. Normal behavior. Normal judgment and thought content.   Assessment and Plan:  Pregnancy: G1P0 at 3816w0d  1. Gestational diabetes mellitus (GDM) controlled on oral hypoglycemic drug, antepartum FBS 156-190, PP 124-265 - US MFM OB FOLLOW UP; Future - glyBURIDE (DIABETA) 2.5 MG tablet; Take 1 tablet (2.5 mg total) by mouth 2 (two) times daily with a meal.  Dispense: 60 tablet; Refill: 3  2. Pregnancy, supervision, high-risk, third trimester RTC for NST and review her results on  medication  Preterm labor symptoms and general obstetric precautions including but not limited to vaginal bleeding, contractions, leaking of fluid and fetal movement were reviewed in detail with the patient. Please refer to After Visit Summary for other counseling recommendations.  Return in about 1 week (around 04/09/2016).   Adam PhenixJames G Arnold, MD

## 2016-04-02 NOTE — Progress Notes (Signed)
Patient reports lower back pain; states her sugars have been high and she's not able to sleep well anymore

## 2016-04-02 NOTE — Patient Instructions (Signed)
Type 1 or Type 2 Diabetes Mellitus During Pregnancy, Diagnosis Type 1 diabetes (type 1 diabetes mellitus) and type 2 diabetes (type 2 diabetes mellitus) are long-term (chronic) diseases. Your diabetes may be caused by one or both of these problems:  Your body does not make enough of a hormone called insulin.  Your body does not respond in a normal way to insulin that it makes. Insulin lets sugars (glucose) go into cells in the body. This gives you energy. If you have diabetes, sugars cannot get into cells. This causes high blood sugar (hyperglycemia). If diabetes is treated, it may not hurt you or your baby. Your doctor will set treatment goals for you. In general, you should have these blood sugar levels:  After not eating for a long time (fasting): 95 mg/dL (5.3 mmol/L).  After meals (postprandial):  One hour after a meal: at or below 140 mg/dL (7.8 mmol/L).  Two hours after a meal: at or below 120 mg/dL (6.7 mmol/L).  A1c (hemoglobin A1c) level: 6-6.5%. Follow these instructions at home: Questions to Ask Your Doctor  You may want to ask these questions:  Do I need to meet with a diabetes educator?  Where can I find a support group for people with diabetes?  What equipment will I need to care for myself at home?  What diabetes medicines do I need? When should I take them?  How often do I need to check my blood sugar?  What number can I call if I have questions?  When is my next doctor's visit? General instructions  Take over-the-counter and prescription medicines only as told by your doctor.  Stay at a healthy weight during pregnancy.  Keep all follow-up visits as told by your doctor. This is important. Contact a doctor if:  Your blood sugar is at or above 240 mg/dL ([phone] mmol/L).  Your blood sugar is at or above 200 mg/dL ([phone] mmol/L), and you have ketones in your pee (urine).  You have been sick or have had a fever for 2 days or more, and you are not getting  better.  You have any of these problems for more than 6 hours:  You cannot eat or drink.  You feel sick to your stomach (nauseous).  You throw up (vomit).  You have watery poop (diarrhea). Get help right away if:  Your blood sugar is lower than 54 mg/dL (3 mmol/L).  You get confused.  You have trouble:  Thinking clearly.  Breathing.  Your baby moves less than normal.  You have:  Moderate or large ketone levels in your pee (urine).  Bleeding from your vagina.  Unusual fluid coming from your vagina.  Early contractions. These may feel like tightness in your belly. This information is not intended to replace advice given to you by your health care provider. Make sure you discuss any questions you have with your health care provider. Document Released: 08/22/2015 Document Revised: 10/06/2015 Document Reviewed: 06/03/2015 Elsevier Interactive Patient Education  2017 [employer]

## 2016-04-09 ENCOUNTER — Encounter: Payer: Self-pay | Admitting: Obstetrics & Gynecology

## 2016-04-09 ENCOUNTER — Ambulatory Visit (INDEPENDENT_AMBULATORY_CARE_PROVIDER_SITE_OTHER): Payer: Medicaid Other | Admitting: Family Medicine

## 2016-04-09 ENCOUNTER — Ambulatory Visit: Payer: Self-pay

## 2016-04-09 VITALS — BP 127/75 | HR 92 | Wt 283.7 lb

## 2016-04-09 DIAGNOSIS — O24415 Gestational diabetes mellitus in pregnancy, controlled by oral hypoglycemic drugs: Secondary | ICD-10-CM

## 2016-04-09 DIAGNOSIS — O0993 Supervision of high risk pregnancy, unspecified, third trimester: Secondary | ICD-10-CM

## 2016-04-09 DIAGNOSIS — Z3689 Encounter for other specified antenatal screening: Secondary | ICD-10-CM | POA: Diagnosis not present

## 2016-04-09 MED ORDER — GLYBURIDE 2.5 MG PO TABS: 2.5000 mg | ORAL_TABLET | Freq: Two times a day (BID) | ORAL | 3 refills | Status: DC

## 2016-04-09 NOTE — Progress Notes (Signed)
    PRENATAL VISIT NOTE  Subjective:  Cheryl Boyle is a 30 y.o. G1P0 at 4365w0d being seen today for ongoing prenatal care.  She is currently monitored for the following issues for this high-risk pregnancy and has Obesity in pregnancy; Pregnancy, supervision, high-risk, third trimester; Gonorrhea in pregnancy, antepartum, third trimester; and Diabetes mellitus during pregnancy, antepartum on her problem list.  Patient reports no complaints.  Contractions: Not present. Vag. Bleeding: None.  Movement: Present. Denies leaking of fluid.   The following portions of the patient's history were reviewed and updated as appropriate: allergies, current medications, past family history, past medical history, past social history, past surgical history and problem list. Problem list updated.  Objective:   Vitals:   04/09/16 1456  BP: 127/75  Pulse: 92  Weight: 283 lb 11.2 oz (128.7 kg)    Fetal Status: Fetal Heart Rate (bpm): NST   Movement: Present  Presentation: Homero FellersFrank Breech  General:  Alert, oriented and cooperative. Patient is in no acute distress.  Skin: Skin is warm and dry. No rash noted.   Cardiovascular: Normal heart rate noted  Respiratory: Normal respiratory effort, no problems with respiration noted  Abdomen: Soft, gravid, appropriate for gestational age. Pain/Pressure: Present     Pelvic:  Cervical exam deferred        Extremities: Normal range of motion.  Edema: None  Mental Status: Normal mood and affect. Normal behavior. Normal judgment and thought content.  No book today Reports FBS 80's and post meals 160's NST reviewed and reactive. Assessment and Plan:  Pregnancy: G1P0 at 5865w0d  1. Pregnancy, supervision, high-risk, third trimester Continue prenatal care.   2. Gestational diabetes mellitus (GDM) controlled on oral hypoglycemic drug, antepartum Increase glyburide to 5 mg q am and 2.5 mg q hs Bring book and meter to each visit. Risks of elevated BS and IUFD reviewed in  detail. - Fetal nonstress test - glyBURIDE (DIABETA) 2.5 MG tablet; Take 1-2 tablets (2.5-5 mg total) by mouth 2 (two) times daily with a meal. 2 tabs q am, 1 tab q hs  Dispense: 60 tablet; Refill: 3 - US OB Limited  Preterm labor symptoms and general obstetric precautions including but not limited to vaginal bleeding, contractions, leaking of fluid and fetal movement were reviewed in detail with the patient. Please refer to After Visit Summary for other counseling recommendations.  Return in about 7 days (around 04/16/2016) for NST/AFI only;  12/11  Ob fu and NST.   Reva Boresanya S Moreen Piggott, MD

## 2016-04-09 NOTE — Patient Instructions (Signed)
 Gestational Diabetes Mellitus, Diagnosis Gestational diabetes (gestational diabetes mellitus) is a temporary form of diabetes that some women develop during pregnancy. It usually occurs around weeks 24-28 of pregnancy and goes away after delivery. Hormonal changes during pregnancy can interfere with insulin production and function, which may result in one or both of these problems:  The pancreas does not make enough of a hormone called insulin.  Cells in the body do not respond properly to insulin that the body makes (insulin resistance). Normally, insulin allows sugars (glucose) to enter cells in the body. The cells use glucose for energy. Insulin resistance or lack of insulin causes excess glucose to build up in the blood instead of going into cells. As a result, high blood glucose (hyperglycemia) develops. What are the risks? If gestational diabetes is treated, it is unlikely to cause problems. If it is not controlled with treatment, it may cause problems during labor and delivery, and some of those problems can be harmful to the unborn baby (fetus) and the mother. Uncontrolled gestational diabetes may also cause the newborn baby to have breathing problems and low blood glucose. Women who get gestational diabetes are more likely to develop it if they get pregnant again, and they are more likely to develop type 2 diabetes in the future. What increases the risk? This condition may be more likely to develop in pregnant women who:  Are older than age 25 during pregnancy.  Have a family history of diabetes.  Are overweight.  Had gestational diabetes in the past.  Have polycystic ovarian syndrome (PCOS).  Are pregnant with twins or multiples.  Are of American-Indian, African-American, Hispanic/Latino, or Asian/Pacific Islander descent. What are the signs or symptoms? Most women do not notice symptoms of gestational diabetes because the symptoms are similar to normal symptoms of pregnancy.  Symptoms of gestational diabetes may include:  Increased thirst (polydipsia).  Increased hunger(polyphagia).  Increased urination (polyuria). How is this diagnosed? This condition may be diagnosed based on your blood glucose level, which may be checked with one or more of the following blood tests:  A fasting blood glucose (FBG) test. You will not be allowed to eat (you will fast) for at least 8 hours before a blood sample is taken.  A random blood glucose test. This checks your blood glucose at any time of day regardless of when you ate.  An oral glucose tolerance test (OGTT). This is usually done during weeks 24-28 of pregnancy.  For this test, you will have an FBG test done. Then, you will drink a beverage that contains glucose. Your blood glucose will be tested again 1 hour after drinking the glucose beverage (1-hour OGTT).  If the 1-hour OGTT result is at or above 140 mg/dL (7.8 mmol/L), you will repeat the OGTT. This time, your blood glucose will be tested 3 hours after drinking the glucose beverage (3-hour OGTT). If you have risk factors, you may be screened for undiagnosed type 2 diabetes at your first health care visit during your pregnancy (prenatal visit). How is this treated? Your treatment may be managed by a specialist called an endocrinologist. This condition is treated by following instructions from your health care provider about:  Eating a healthier diet and getting more physical activity. These changes are the most important ways to manage gestational diabetes.  Checking your blood glucose. Do this as often as told.  Taking diabetes medicines or insulin every day. These will only be prescribed if they are needed.  If you use   insulin, you may need to adjust your dosage based on how physically active you are and what foods you eat. Your health care provider will tell you how to do this. Your health care provider will set treatment goals for you based on the stage of  your pregnancy and any other medical conditions you have. Generally, the goal of treatment is to maintain the following blood glucose levels during pregnancy:  Fasting: at or below 95 mg/dL (5.3 mmol/L).  After meals (postprandial):  One hour after a meal: at or below 140 mg/dL (7.8 mmol/L).  Two hours after a meal: at or below 120 mg/dL (6.7 mmol/L).  A1c (hemoglobin A1c) level: 6-6.5%. Follow these instructions at home: Questions to Ask Your Health Care Provider  Consider asking the following questions:  Do I need to meet with a diabetes educator?  Where can I find a support group for people with diabetes?  What equipment will I need to manage my diabetes at home?  What diabetes medicines do I need, and when should I take them?  How often do I need to check my blood glucose?  What number can I call if I have questions?  When is my next appointment? General Instructions  Take over-the-counter and prescription medicines only as told by your health care provider.  Manage your weight gain during pregnancy. The amount of weight that you are expected to gain depends on your pre-pregnancy BMI (body mass index).  Keep all follow-up visits as told by your health care provider. This is important.  For more information about diabetes, visit:  American Diabetes Association (ADA): www.diabetes.org  American Association of Diabetes Educators (AADE): www.diabeteseducator.org/patient-resources Contact a health care provider if:  Your blood glucose level is at or above 240 mg/dL (13.3 mmol/L).  Your blood glucose level is at or above 200 mg/dL (11.1 mmol/L) and you have ketones in your urine.  You have been sick or have had a fever for 2 days or more and you are not getting better.  You have any of the following problems for more than 6 hours:  You cannot eat or drink.  You have nausea and vomiting.  You have diarrhea. Get help right away if:  Your blood glucose is below 54  mg/dL (3 mmol/L).  You become confused or you have trouble thinking clearly.  You have difficulty breathing.  You have moderate or large ketone levels in your urine.  Your baby is moving around less than usual.  You develop unusual discharge or bleeding from your vagina.  You start having contractions early (prematurely). Contractions may feel like a tightening in your lower abdomen. This information is not intended to replace advice given to you by your health care provider. Make sure you discuss any questions you have with your health care provider. Document Released: 08/06/2000 Document Revised: 10/06/2015 Document Reviewed: 06/03/2015 Elsevier Interactive Patient Education  2017 [employer]   Breastfeeding Deciding to breastfeed is one of the best choices you can make for you and your baby. A change in hormones during pregnancy causes your breast tissue to grow and increases the number and size of your milk ducts. These hormones also allow proteins, sugars, and fats from your blood supply to make breast milk in your milk-producing glands. Hormones prevent breast milk from being released before your baby is born as well as prompt milk flow after birth. Once breastfeeding has begun, thoughts of your baby, as well as his or her sucking or crying, can stimulate the   release of milk from your milk-producing glands. Benefits of breastfeeding For Your Baby  Your first milk (colostrum) helps your baby's digestive system function better.  There are antibodies in your milk that help your baby fight off infections.  Your baby has a lower incidence of asthma, allergies, and sudden infant death syndrome.  The nutrients in breast milk are better for your baby than infant formulas and are designed uniquely for your baby's needs.  Breast milk improves your baby's brain development.  Your baby is less likely to develop other conditions, such as childhood obesity, asthma, or type 2 diabetes  mellitus. For You  Breastfeeding helps to create a very special bond between you and your baby.  Breastfeeding is convenient. Breast milk is always available at the correct temperature and costs nothing.  Breastfeeding helps to burn calories and helps you lose the weight gained during pregnancy.  Breastfeeding makes your uterus contract to its prepregnancy size faster and slows bleeding (lochia) after you give birth.  Breastfeeding helps to lower your risk of developing type 2 diabetes mellitus, osteoporosis, and breast or ovarian cancer later in life. Signs that your baby is hungry Early Signs of Hunger  Increased alertness or activity.  Stretching.  Movement of the head from side to side.  Movement of the head and opening of the mouth when the corner of the mouth or cheek is stroked (rooting).  Increased sucking sounds, smacking lips, cooing, sighing, or squeaking.  Hand-to-mouth movements.  Increased sucking of fingers or hands. Late Signs of Hunger  Fussing.  Intermittent crying. Extreme Signs of Hunger  Signs of extreme hunger will require calming and consoling before your baby will be able to breastfeed successfully. Do not wait for the following signs of extreme hunger to occur before you initiate breastfeeding:  Restlessness.  A loud, strong cry.  Screaming. Breastfeeding basics  Breastfeeding Initiation  Find a comfortable place to sit or lie down, with your neck and back well supported.  Place a pillow or rolled up blanket under your baby to bring him or her to the level of your breast (if you are seated). Nursing pillows are specially designed to help support your arms and your baby while you breastfeed.  Make sure that your baby's abdomen is facing your abdomen.  Gently massage your breast. With your fingertips, massage from your chest wall toward your nipple in a circular motion. This encourages milk flow. You may need to continue this action during the  feeding if your milk flows slowly.  Support your breast with 4 fingers underneath and your thumb above your nipple. Make sure your fingers are well away from your nipple and your baby's mouth.  Stroke your baby's lips gently with your finger or nipple.  When your baby's mouth is open wide enough, quickly bring your baby to your breast, placing your entire nipple and as much of the colored area around your nipple (areola) as possible into your baby's mouth.  More areola should be visible above your baby's upper lip than below the lower lip.  Your baby's tongue should be between his or her lower gum and your breast.  Ensure that your baby's mouth is correctly positioned around your nipple (latched). Your baby's lips should create a seal on your breast and be turned out (everted).  It is common for your baby to suck about 2-3 minutes in order to start the flow of breast milk. Latching  Teaching your baby how to latch on to your breast   properly is very important. An improper latch can cause nipple pain and decreased milk supply for you and poor weight gain in your baby. Also, if your baby is not latched onto your nipple properly, he or she may swallow some air during feeding. This can make your baby fussy. Burping your baby when you switch breasts during the feeding can help to get rid of the air. However, teaching your baby to latch on properly is still the best way to prevent fussiness from swallowing air while breastfeeding. Signs that your baby has successfully latched on to your nipple:  Silent tugging or silent sucking, without causing you pain.  Swallowing heard between every 3-4 sucks.  Muscle movement above and in front of his or her ears while sucking. Signs that your baby has not successfully latched on to nipple:  Sucking sounds or smacking sounds from your baby while breastfeeding.  Nipple pain. If you think your baby has not latched on correctly, slip your finger into the  corner of your baby's mouth to break the suction and place it between your baby's gums. Attempt breastfeeding initiation again. Signs of Successful Breastfeeding  Signs from your baby:  A gradual decrease in the number of sucks or complete cessation of sucking.  Falling asleep.  Relaxation of his or her body.  Retention of a small amount of milk in his or her mouth.  Letting go of your breast by himself or herself. Signs from you:  Breasts that have increased in firmness, weight, and size 1-3 hours after feeding.  Breasts that are softer immediately after breastfeeding.  Increased milk volume, as well as a change in milk consistency and color by the fifth day of breastfeeding.  Nipples that are not sore, cracked, or bleeding. Signs That Your Baby is Getting Enough Milk  Wetting at least 1-2 diapers during the first 24 hours after birth.  Wetting at least 5-6 diapers every 24 hours for the first week after birth. The urine should be clear or pale yellow by 5 days after birth.  Wetting 6-8 diapers every 24 hours as your baby continues to grow and develop.  At least 3 stools in a 24-hour period by age 5 days. The stool should be soft and yellow.  At least 3 stools in a 24-hour period by age 7 days. The stool should be seedy and yellow.  No loss of weight greater than 10% of birth weight during the first 3 days of age.  Average weight gain of 4-7 ounces (113-198 g) per week after age 4 days.  Consistent daily weight gain by age 5 days, without weight loss after the age of 2 weeks. After a feeding, your baby may spit up a small amount. This is common. Breastfeeding frequency and duration Frequent feeding will help you make more milk and can prevent sore nipples and breast engorgement. Breastfeed when you feel the need to reduce the fullness of your breasts or when your baby shows signs of hunger. This is called "breastfeeding on demand." Avoid introducing a pacifier to your baby  while you are working to establish breastfeeding (the first 4-6 weeks after your baby is born). After this time you may choose to use a pacifier. Research has shown that pacifier use during the first year of a baby's life decreases the risk of sudden infant death syndrome (SIDS). Allow your baby to feed on each breast as long as he or she wants. Breastfeed until your baby is finished feeding. When your baby   unlatches or falls asleep while feeding from the first breast, offer the second breast. Because newborns are often sleepy in the first few weeks of life, you may need to awaken your baby to get him or her to feed. Breastfeeding times will vary from baby to baby. However, the following rules can serve as a guide to help you ensure that your baby is properly fed:  Newborns (babies 4 weeks of age or younger) may breastfeed every 1-3 hours.  Newborns should not go longer than 3 hours during the day or 5 hours during the night without breastfeeding.  You should breastfeed your baby a minimum of 8 times in a 24-hour period until you begin to introduce solid foods to your baby at around 6 months of age. Breast milk pumping Pumping and storing breast milk allows you to ensure that your baby is exclusively fed your breast milk, even at times when you are unable to breastfeed. This is especially important if you are going back to work while you are still breastfeeding or when you are not able to be present during feedings. Your lactation consultant can give you guidelines on how long it is safe to store breast milk. A breast pump is a machine that allows you to pump milk from your breast into a sterile bottle. The pumped breast milk can then be stored in a refrigerator or freezer. Some breast pumps are operated by hand, while others use electricity. Ask your lactation consultant which type will work best for you. Breast pumps can be purchased, but some hospitals and breastfeeding support groups lease breast  pumps on a monthly basis. A lactation consultant can teach you how to hand express breast milk, if you prefer not to use a pump. Caring for your breasts while you breastfeed Nipples can become dry, cracked, and sore while breastfeeding. The following recommendations can help keep your breasts moisturized and healthy:  Avoid using soap on your nipples.  Wear a supportive bra. Although not required, special nursing bras and tank tops are designed to allow access to your breasts for breastfeeding without taking off your entire bra or top. Avoid wearing underwire-style bras or extremely tight bras.  Air dry your nipples for 3-4minutes after each feeding.  Use only cotton bra pads to absorb leaked breast milk. Leaking of breast milk between feedings is normal.  Use lanolin on your nipples after breastfeeding. Lanolin helps to maintain your skin's normal moisture barrier. If you use pure lanolin, you do not need to wash it off before feeding your baby again. Pure lanolin is not toxic to your baby. You may also hand express a few drops of breast milk and gently massage that milk into your nipples and allow the milk to air dry. In the first few weeks after giving birth, some women experience extremely full breasts (engorgement). Engorgement can make your breasts feel heavy, warm, and tender to the touch. Engorgement peaks within 3-5 days after you give birth. The following recommendations can help ease engorgement:  Completely empty your breasts while breastfeeding or pumping. You may want to start by applying warm, moist heat (in the shower or with warm water-soaked hand towels) just before feeding or pumping. This increases circulation and helps the milk flow. If your baby does not completely empty your breasts while breastfeeding, pump any extra milk after he or she is finished.  Wear a snug bra (nursing or regular) or tank top for 1-2 days to signal your body to slightly decrease milk    production.  Apply ice packs to your breasts, unless this is too uncomfortable for you.  Make sure that your baby is latched on and positioned properly while breastfeeding. If engorgement persists after 48 hours of following these recommendations, contact your health care provider or a lactation consultant. Overall health care recommendations while breastfeeding  Eat healthy foods. Alternate between meals and snacks, eating 3 of each per day. Because what you eat affects your breast milk, some of the foods may make your baby more irritable than usual. Avoid eating these foods if you are sure that they are negatively affecting your baby.  Drink milk, fruit juice, and water to satisfy your thirst (about 10 glasses a day).  Rest often, relax, and continue to take your prenatal vitamins to prevent fatigue, stress, and anemia.  Continue breast self-awareness checks.  Avoid chewing and smoking tobacco. Chemicals from cigarettes that pass into breast milk and exposure to secondhand smoke may harm your baby.  Avoid alcohol and drug use, including marijuana. Some medicines that may be harmful to your baby can pass through breast milk. It is important to ask your health care provider before taking any medicine, including all over-the-counter and prescription medicine as well as vitamin and herbal supplements. It is possible to become pregnant while breastfeeding. If birth control is desired, ask your health care provider about options that will be safe for your baby. Contact a health care provider if:  You feel like you want to stop breastfeeding or have become frustrated with breastfeeding.  You have painful breasts or nipples.  Your nipples are cracked or bleeding.  Your breasts are red, tender, or warm.  You have a swollen area on either breast.  You have a fever or chills.  You have nausea or vomiting.  You have drainage other than breast milk from your nipples.  Your breasts do not  become full before feedings by the fifth day after you give birth.  You feel sad and depressed.  Your baby is too sleepy to eat well.  Your baby is having trouble sleeping.  Your baby is wetting less than 3 diapers in a 24-hour period.  Your baby has less than 3 stools in a 24-hour period.  Your baby's skin or the white part of his or her eyes becomes yellow.  Your baby is not gaining weight by 5 days of age. Get help right away if:  Your baby is overly tired (lethargic) and does not want to wake up and feed.  Your baby develops an unexplained fever. This information is not intended to replace advice given to you by your health care provider. Make sure you discuss any questions you have with your health care provider. Document Released: 04/30/2005 Document Revised: 10/12/2015 Document Reviewed: 10/22/2012 Elsevier Interactive Patient Education  2017 [employer].  

## 2016-04-09 NOTE — Progress Notes (Signed)
US for growth scheduled 12/11 Pt informed that the ultrasound is considered a limited OB ultrasound and is not intended to be a complete ultrasound exam.  Patient also informed that the ultrasound is not being completed with the intent of assessing for fetal or placental anomalies or any pelvic abnormalities.  Explained that the purpose of today's ultrasound is to assess for AFI and presentation.  Patient acknowledges the purpose of the exam and the limitations of the study.

## 2016-04-12 ENCOUNTER — Ambulatory Visit (INDEPENDENT_AMBULATORY_CARE_PROVIDER_SITE_OTHER): Payer: Medicaid Other | Admitting: *Deleted

## 2016-04-12 VITALS — BP 117/70 | HR 100

## 2016-04-12 DIAGNOSIS — O24415 Gestational diabetes mellitus in pregnancy, controlled by oral hypoglycemic drugs: Secondary | ICD-10-CM | POA: Diagnosis not present

## 2016-04-12 NOTE — Progress Notes (Addendum)
Pt reports decreased FM yesterday and today. Pt felt good FM during NST.

## 2016-04-12 NOTE — Progress Notes (Signed)
04/12/16 NST reviewed and reactive

## 2016-04-16 ENCOUNTER — Other Ambulatory Visit: Payer: Medicaid Other | Admitting: Obstetrics & Gynecology

## 2016-04-20 ENCOUNTER — Ambulatory Visit (HOSPITAL_COMMUNITY)
Admission: RE | Admit: 2016-04-20 | Discharge: 2016-04-20 | Disposition: A | Discharge status: Home / Self Care | Payer: Medicaid Other | Source: Ambulatory Visit | Attending: Obstetrics & Gynecology | Admitting: Obstetrics & Gynecology

## 2016-04-20 ENCOUNTER — Ambulatory Visit (INDEPENDENT_AMBULATORY_CARE_PROVIDER_SITE_OTHER): Payer: Medicaid Other | Admitting: Obstetrics & Gynecology

## 2016-04-20 ENCOUNTER — Other Ambulatory Visit: Payer: Self-pay | Admitting: Obstetrics & Gynecology

## 2016-04-20 VITALS — Wt 288.6 lb

## 2016-04-20 DIAGNOSIS — O24415 Gestational diabetes mellitus in pregnancy, controlled by oral hypoglycemic drugs: Secondary | ICD-10-CM

## 2016-04-20 DIAGNOSIS — O0993 Supervision of high risk pregnancy, unspecified, third trimester: Secondary | ICD-10-CM

## 2016-04-20 DIAGNOSIS — O99213 Obesity complicating pregnancy, third trimester: Secondary | ICD-10-CM | POA: Insufficient documentation

## 2016-04-20 DIAGNOSIS — Z3A34 34 weeks gestation of pregnancy: Secondary | ICD-10-CM

## 2016-04-20 DIAGNOSIS — O24419 Gestational diabetes mellitus in pregnancy, unspecified control: Secondary | ICD-10-CM | POA: Insufficient documentation

## 2016-04-20 NOTE — Progress Notes (Signed)
NST performed today was reviewed and was found to be reactive.  Continue recommended antenatal testing and prenatal care.  

## 2016-04-20 NOTE — Progress Notes (Signed)
Sent to [facility]FM for afi.

## 2016-04-23 ENCOUNTER — Ambulatory Visit (HOSPITAL_COMMUNITY)
Admission: RE | Admit: 2016-04-23 | Discharge: 2016-04-23 | Disposition: A | Discharge status: Home / Self Care | Payer: Medicaid Other | Source: Ambulatory Visit | Attending: Obstetrics & Gynecology | Admitting: Obstetrics & Gynecology

## 2016-04-23 ENCOUNTER — Other Ambulatory Visit: Payer: Medicaid Other | Admitting: Obstetrics & Gynecology

## 2016-04-23 DIAGNOSIS — O24415 Gestational diabetes mellitus in pregnancy, controlled by oral hypoglycemic drugs: Secondary | ICD-10-CM | POA: Insufficient documentation

## 2016-04-23 DIAGNOSIS — O99213 Obesity complicating pregnancy, third trimester: Secondary | ICD-10-CM | POA: Diagnosis not present

## 2016-04-23 DIAGNOSIS — Z3A35 35 weeks gestation of pregnancy: Secondary | ICD-10-CM | POA: Insufficient documentation

## 2016-04-26 ENCOUNTER — Ambulatory Visit (INDEPENDENT_AMBULATORY_CARE_PROVIDER_SITE_OTHER): Payer: Medicaid Other | Admitting: Obstetrics & Gynecology

## 2016-04-26 DIAGNOSIS — O24415 Gestational diabetes mellitus in pregnancy, controlled by oral hypoglycemic drugs: Secondary | ICD-10-CM | POA: Diagnosis not present

## 2016-04-26 NOTE — Progress Notes (Signed)
Pt was reminded to bring [facility] log book to each visit.

## 2016-04-26 NOTE — Progress Notes (Signed)
NST reviewed and reactive.  Zafiro Routson L. Harraway-Smith, M.D., FACOG    

## 2016-04-30 ENCOUNTER — Encounter (HOSPITAL_COMMUNITY): Payer: Self-pay

## 2016-04-30 ENCOUNTER — Inpatient Hospital Stay (HOSPITAL_COMMUNITY)
Admission: AD | Admit: 2016-04-30 | Discharge: 2016-04-30 | Disposition: A | Discharge status: Home / Self Care | Payer: Medicaid Other | Source: Ambulatory Visit | Attending: Family Medicine | Admitting: Family Medicine

## 2016-04-30 DIAGNOSIS — O368130 Decreased fetal movements, third trimester, not applicable or unspecified: Secondary | ICD-10-CM | POA: Insufficient documentation

## 2016-04-30 DIAGNOSIS — Z3A36 36 weeks gestation of pregnancy: Secondary | ICD-10-CM | POA: Diagnosis not present

## 2016-04-30 DIAGNOSIS — O24415 Gestational diabetes mellitus in pregnancy, controlled by oral hypoglycemic drugs: Secondary | ICD-10-CM | POA: Insufficient documentation

## 2016-04-30 HISTORY — DX: Type 2 diabetes mellitus without complications: E11.9

## 2016-04-30 HISTORY — DX: Gestational diabetes mellitus in pregnancy, unspecified control: O24.419

## 2016-04-30 NOTE — MAU Note (Signed)
Baby not moved since 4 am. Then just before arrived felt just a little movement.  No bleeding. No leaking.

## 2016-04-30 NOTE — Discharge Instructions (Signed)
Braxton Hicks Contractions °Contractions of the uterus can occur throughout pregnancy. Contractions are not always a sign that you are in labor.  °WHAT ARE BRAXTON HICKS CONTRACTIONS?  °Contractions that occur before labor are called Braxton Hicks contractions, or false labor. Toward the end of pregnancy (32-34 weeks), these contractions can develop more often and may become more forceful. This is not true labor because these contractions do not result in opening (dilatation) and thinning of the cervix. They are sometimes difficult to tell apart from true labor because these contractions can be forceful and people have different pain tolerances. You should not feel embarrassed if you go to the hospital with false labor. Sometimes, the only way to tell if you are in true labor is for your health care provider to look for changes in the cervix. °If there are no prenatal problems or other health problems associated with the pregnancy, it is completely safe to be sent home with false labor and await the onset of true labor. °HOW CAN YOU TELL THE DIFFERENCE BETWEEN TRUE AND FALSE LABOR? °False Labor  °· The contractions of false labor are usually shorter and not as hard as those of true labor.   °· The contractions are usually irregular.   °· The contractions are often felt in the front of the lower abdomen and in the groin.   °· The contractions may go away when you walk around or change positions while lying down.   °· The contractions get weaker and are shorter lasting as time goes on.   °· The contractions do not usually become progressively stronger, regular, and closer together as with true labor.   °True Labor  °· Contractions in true labor last 30-70 seconds, become very regular, usually become more intense, and increase in frequency.   °· The contractions do not go away with walking.   °· The discomfort is usually felt in the top of the uterus and spreads to the lower abdomen and low back.   °· True labor can be  determined by your health care provider with an exam. This will show that the cervix is dilating and getting thinner.   °WHAT TO REMEMBER °· Keep up with your usual exercises and follow other instructions given by your health care provider.   °· Take medicines as directed by your health care provider.   °· Keep your regular prenatal appointments.   °· Eat and drink lightly if you think you are going into labor.   °· If Braxton Hicks contractions are making you uncomfortable:   °¨ Change your position from lying down or resting to walking, or from walking to resting.   °¨ Sit and rest in a tub of warm water.   °¨ Drink 2-3 glasses of water. Dehydration may cause these contractions.   °¨ Do slow and deep breathing several times an hour.   °WHEN SHOULD I SEEK IMMEDIATE MEDICAL CARE? °Seek immediate medical care if: °· Your contractions become stronger, more regular, and closer together.   °· You have fluid leaking or gushing from your vagina.   °· You have a fever.   °· You pass blood-tinged mucus.   °· You have vaginal bleeding.   °· You have continuous abdominal pain.   °· You have low back pain that you never had before.   °· You feel your baby's head pushing down and causing pelvic pressure.   °· Your baby is not moving as much as it used to.   °This information is not intended to replace advice given to you by your health care provider. Make sure you discuss any questions you have with your health care   provider. Document Released: 04/30/2005 Document Revised: 08/22/2015 Document Reviewed: 02/09/2013 Elsevier Interactive Patient Education  2017 [employer] Third Trimester of Pregnancy The third trimester is from week 29 through week 40 (months 7 through 9). The third trimester is a time when the unborn baby (fetus) is growing rapidly. At the end of the ninth month, the fetus is about 20 inches in length and weighs 6-10 pounds. Body changes during your third trimester Your body goes through many changes  during pregnancy. The changes vary from woman to woman. During the third trimester:  Your weight will continue to increase. You can expect to gain 25-35 pounds (11-16 kg) by the end of the pregnancy.  You may begin to get stretch marks on your hips, abdomen, and breasts.  You may urinate more often because the fetus is moving lower into your pelvis and pressing on your bladder.  You may develop or continue to have heartburn. This is caused by increased hormones that slow down muscles in the digestive tract.  You may develop or continue to have constipation because increased hormones slow digestion and cause the muscles that push waste through your intestines to relax.  You may develop hemorrhoids. These are swollen veins (varicose veins) in the rectum that can itch or be painful.  You may develop swollen, bulging veins (varicose veins) in your legs.  You may have increased body aches in the pelvis, back, or thighs. This is due to weight gain and increased hormones that are relaxing your joints.  You may have changes in your hair. These can include thickening of your hair, rapid growth, and changes in texture. Some women also have hair loss during or after pregnancy, or hair that feels dry or thin. Your hair will most likely return to normal after your baby is born.  Your breasts will continue to grow and they will continue to become tender. A yellow fluid (colostrum) may leak from your breasts. This is the first milk you are producing for your baby.  Your belly button may stick out.  You may notice more swelling in your hands, face, or ankles.  You may have increased tingling or numbness in your hands, arms, and legs. The skin on your belly may also feel numb.  You may feel short of breath because of your expanding uterus.  You may have more problems sleeping. This can be caused by the size of your belly, increased need to urinate, and an increase in your body's metabolism.  You may  notice the fetus "dropping," or moving lower in your abdomen.  You may have increased vaginal discharge.  Your cervix becomes thin and soft (effaced) near your due date. What to expect at prenatal visits You will have prenatal exams every 2 weeks until week 36. Then you will have weekly prenatal exams. During a routine prenatal visit:  You will be weighed to make sure you and the fetus are growing normally.  Your blood pressure will be taken.  Your abdomen will be measured to track your baby's growth.  The fetal heartbeat will be listened to.  Any test results from the previous visit will be discussed.  You may have a cervical check near your due date to see if you have effaced. At around 36 weeks, your health care provider will check your cervix. At the same time, your health care provider will also perform a test on the secretions of the vaginal tissue. This test is to determine if a type of bacteria, Group  B streptococcus, is present. Your health care provider will explain this further. Your health care provider may ask you:  What your birth plan is.  How you are feeling.  If you are feeling the baby move.  If you have had any abnormal symptoms, such as leaking fluid, bleeding, severe headaches, or abdominal cramping.  If you are using any tobacco products, including cigarettes, chewing tobacco, and electronic cigarettes.  If you have any questions. Other tests or screenings that may be performed during your third trimester include:  Blood tests that check for low iron levels (anemia).  Fetal testing to check the health, activity level, and growth of the fetus. Testing is done if you have certain medical conditions or if there are problems during the pregnancy.  Nonstress test (NST). This test checks the health of your baby to make sure there are no signs of problems, such as the baby not getting enough oxygen. During this test, a belt is placed around your belly. The baby  is made to move, and its heart rate is monitored during movement. What is false labor? False labor is a condition in which you feel small, irregular tightenings of the muscles in the womb (contractions) that eventually go away. These are called Braxton Hicks contractions. Contractions may last for hours, days, or even weeks before true labor sets in. If contractions come at regular intervals, become more frequent, increase in intensity, or become painful, you should see your health care provider. What are the signs of labor?  Abdominal cramps.  Regular contractions that start at 10 minutes apart and become stronger and more frequent with time.  Contractions that start on the top of the uterus and spread down to the lower abdomen and back.  Increased pelvic pressure and dull back pain.  A watery or bloody mucus discharge that comes from the vagina.  Leaking of amniotic fluid. This is also known as your "water breaking." It could be a slow trickle or a gush. Let your doctor know if it has a color or strange odor. If you have any of these signs, call your health care provider right away, even if it is before your due date. Follow these instructions at home: Eating and drinking  Continue to eat regular, healthy meals.  Do not eat:  Raw meat or meat spreads.  Unpasteurized milk or cheese.  Unpasteurized juice.  Store-made salad.  Refrigerated smoked seafood.  Hot dogs or deli meat, unless they are piping hot.  More than 6 ounces of albacore tuna a week.  Shark, swordfish, king mackerel, or tile fish.  Store-made salads.  Raw sprouts, such as mung bean or alfalfa sprouts.  Take prenatal vitamins as told by your health care provider.  Take 1000 mg of calcium daily as told by your health care provider.  If you develop constipation:  Take over-the-counter or prescription medicines.  Drink enough fluid to keep your urine clear or pale yellow.  Eat foods that are high in  fiber, such as fresh fruits and vegetables, whole grains, and beans.  Limit foods that are high in fat and processed sugars, such as fried and sweet foods. Activity  Exercise only as directed by your health care provider. Healthy pregnant women should aim for 2 hours and 30 minutes of moderate exercise per week. If you experience any pain or discomfort while exercising, stop.  Avoid heavy lifting.  Do not exercise in extreme heat or humidity, or at high altitudes.  Wear low-heel, comfortable shoes.  Practice good posture.  Do not travel far distances unless it is absolutely necessary and only with the approval of your health care provider.  Wear your seat belt at all times while in a car, on a bus, or on a plane.  Take frequent breaks and rest with your legs elevated if you have leg cramps or low back pain.  Do not use hot tubs, steam rooms, or saunas.  You may continue to have sex unless your health care provider tells you otherwise. Lifestyle  Do not use any products that contain nicotine or tobacco, such as cigarettes and e-cigarettes. If you need help quitting, ask your health care provider.  Do not drink alcohol.  Do not use any medicinal herbs or unprescribed drugs. These chemicals affect the formation and growth of the baby.  If you develop varicose veins:  Wear support pantyhose or compression stockings as told by your healthcare provider.  Elevate your feet for 15 minutes, 3-4 times a day.  Wear a supportive maternity bra to help with breast tenderness. General instructions  Take over-the-counter and prescription medicines only as told by your health care provider. There are medicines that are either safe or unsafe to take during pregnancy.  Take warm sitz baths to soothe any pain or discomfort caused by hemorrhoids. Use hemorrhoid cream or witch hazel if your health care provider approves.  Avoid cat litter boxes and soil used by cats. These carry germs that can  cause birth defects in the baby. If you have a cat, ask someone to clean the litter box for you.  To prepare for the arrival of your baby:  Take prenatal classes to understand, practice, and ask questions about the labor and delivery.  Make a trial run to the hospital.  Visit the hospital and tour the maternity area.  Arrange for maternity or paternity leave through employers.  Arrange for family and friends to take care of pets while you are in the hospital.  Purchase a rear-facing car seat and make sure you know how to install it in your car.  Pack your hospital bag.  Prepare the babys nursery. Make sure to remove all pillows and stuffed animals from the baby's crib to prevent suffocation.  Visit your dentist if you have not gone during your pregnancy. Use a soft toothbrush to brush your teeth and be gentle when you floss.  Keep all prenatal follow-up visits as told by your health care provider. This is important. Contact a health care provider if:  You are unsure if you are in labor or if your water has broken.  You become dizzy.  You have mild pelvic cramps, pelvic pressure, or nagging pain in your abdominal area.  You have lower back pain.  You have persistent nausea, vomiting, or diarrhea.  You have an unusual or bad smelling vaginal discharge.  You have pain when you urinate. Get help right away if:  You have a fever.  You are leaking fluid from your vagina.  You have spotting or bleeding from your vagina.  You have severe abdominal pain or cramping.  You have rapid weight loss or weight gain.  You have shortness of breath with chest pain.  You notice sudden or extreme swelling of your face, hands, ankles, feet, or legs.  Your baby makes fewer than 10 movements in 2 hours.  You have severe headaches that do not go away with medicine.  You have vision changes. Summary  The third trimester is from week 29  through week 40, months 7 through 9. The  third trimester is a time when the unborn baby (fetus) is growing rapidly.  During the third trimester, your discomfort may increase as you and your baby continue to gain weight. You may have abdominal, leg, and back pain, sleeping problems, and an increased need to urinate.  During the third trimester your breasts will keep growing and they will continue to become tender. A yellow fluid (colostrum) may leak from your breasts. This is the first milk you are producing for your baby.  False labor is a condition in which you feel small, irregular tightenings of the muscles in the womb (contractions) that eventually go away. These are called Braxton Hicks contractions. Contractions may last for hours, days, or even weeks before true labor sets in.  Signs of labor can include: abdominal cramps; regular contractions that start at 10 minutes apart and become stronger and more frequent with time; watery or bloody mucus discharge that comes from the vagina; increased pelvic pressure and dull back pain; and leaking of amniotic fluid. This information is not intended to replace advice given to you by your health care provider. Make sure you discuss any questions you have with your health care provider. Document Released: 04/24/2001 Document Revised: 10/06/2015 Document Reviewed: 07/01/2012 Elsevier Interactive Patient Education  2017 [employer] Introduction Patient Name: ________________________________________________ Patient Due Date: ____________________ What is a fetal movement count? A fetal movement count is the number of times that you feel your baby move during a certain amount of time. This may also be called a fetal kick count. A fetal movement count is recommended for every pregnant woman. You may be asked to start counting fetal movements as early as week 28 of your pregnancy. Pay attention to when your baby is most active. You may notice your baby's sleep and wake cycles. You may also notice  things that make your baby move more. You should do a fetal movement count:  When your baby is normally most active.  At the same time each day. A good time to count movements is while you are resting, after having something to eat and drink. How do I count fetal movements? 1. Find a quiet, comfortable area. Sit, or lie down on your side. 2. Write down the date, the start time and stop time, and the number of movements that you felt between those two times. Take this information with you to your health care visits. 3. For 2 hours, count kicks, flutters, swishes, rolls, and jabs. You should feel at least 10 movements during 2 hours. 4. You may stop counting after you have felt 10 movements. 5. If you do not feel 10 movements in 2 hours, have something to eat and drink. Then, keep resting and counting for 1 hour. If you feel at least 4 movements during that hour, you may stop counting. Contact a health care provider if:  You feel fewer than 4 movements in 2 hours.  Your baby is not moving like he or she usually does. Date: ____________ Start time: ____________ Stop time: ____________ Movements: ____________ Date: ____________ Start time: ____________ Stop time: ____________ Movements: ____________ Date: ____________ Start time: ____________ Stop time: ____________ Movements: ____________ Date: ____________ Start time: ____________ Stop time: ____________ Movements: ____________ Date: ____________ Start time: ____________ Stop time: ____________ Movements: ____________ Date: ____________ Start time: ____________ Stop time: ____________ Movements: ____________ Date: ____________ Start time: ____________ Stop time: ____________ Movements: ____________ Date: ____________ Start time: ____________ Stop time: ____________ Movements:  ____________ Date: ____________ Start time: ____________ Stop time: ____________ Movements: ____________ This information is not intended to replace advice given to you  by your health care provider. Make sure you discuss any questions you have with your health care provider. Document Released: 05/30/2006 Document Revised: 12/28/2015 Document Reviewed: 06/09/2015 Elsevier Interactive Patient Education  2017 [employer].

## 2016-04-30 NOTE — MAU Provider Note (Signed)
  History     CSN: [id number]  Arrival date and time: 04/30/16 2110   First Provider Initiated Contact with Patient 04/30/16 2208      Chief Complaint  Patient presents with  . Decreased Fetal Movement   Patient is a 30 year old G1P0 at 1836 weeks who presents after noticing decreased fetal movement this afternoon. No CTX, LOF, vaginal bleeding. Complications of pregnancy included GDM A2 on glyburide. Since being here, has felt good fetal movement.     OB History    Gravida Para Term Preterm AB Living   1             SAB TAB Ectopic Multiple Live Births                  Past Medical History:  Diagnosis Date  . Diabetes mellitus without complication (HCC)   . Gestational diabetes     Past Surgical History:  Procedure Laterality Date  . NO PAST SURGERIES      Family History  Problem Relation Age of Onset  . Diabetes Mother     Social History  Substance Use Topics  . Smoking status: Never Smoker  . Smokeless tobacco: Never Used  . Alcohol use No    Allergies:  Allergies  Allergen Reactions  . Influenza Vaccines Swelling    Swelling and high fever    Prescriptions Prior to Admission  Medication Sig Dispense Refill Last Dose  . glyBURIDE (DIABETA) 5 MG tablet Take 5 mg by mouth daily with breakfast. "2 tabs in morning and 1 tab at night"   04/30/2016 at Unknown time  . Prenatal Vit-Fe Fumarate-FA (PRENATAL MULTIVITAMIN) TABS tablet Take 1 tablet by mouth daily at 12 noon.   04/30/2016 at Unknown time    Review of Systems  Constitutional: Negative for chills, fever and malaise/fatigue.  Gastrointestinal: Negative for abdominal pain, constipation, diarrhea, nausea and vomiting.  Genitourinary: Negative for dysuria.  Neurological: Negative for dizziness, loss of consciousness and weakness.   Physical Exam   Blood pressure 123/86, pulse 91, temperature 98 F (36.7 C), temperature source Oral, resp. rate 20, SpO2 98 %.  Physical Exam  Constitutional: She  is oriented to person, place, and time. She appears well-developed and well-nourished.  Cardiovascular: Normal rate and regular rhythm.   Respiratory: Effort normal and breath sounds normal.  GI: Soft. There is no tenderness.  Neurological: She is alert and oriented to person, place, and time.    MAU Course  Procedures  MDM   Assessment and Plan  Patient is a 30 year old G1P0 at 5536 weeks presenting with complaint of decreased fetal movement. No contractions, LOF, or vaginal bleeding. Tracing is Category I and patient now reports good fetal movement.Will discharge home with routine outpatient follow up.   Clearance Cootsndrew Tyson 04/30/2016, 10:10 PM   I have participated in the care of this patient and I agree with the above. Cam HaiSHAW, KIMBERLY CNM 10:43 PM 04/30/2016

## 2016-05-01 ENCOUNTER — Encounter: Payer: Self-pay | Admitting: Obstetrics and Gynecology

## 2016-05-01 ENCOUNTER — Encounter (HOSPITAL_COMMUNITY): Payer: Self-pay

## 2016-05-09 ENCOUNTER — Other Ambulatory Visit: Payer: Self-pay | Admitting: Obstetrics and Gynecology

## 2016-05-14 ENCOUNTER — Inpatient Hospital Stay (HOSPITAL_COMMUNITY)
Admission: AD | Admit: 2016-05-14 | Discharge: 2016-05-18 | DRG: 775 | Disposition: A | Discharge status: Home / Self Care | Payer: Medicaid Other | Source: Ambulatory Visit | Attending: Family Medicine | Admitting: Family Medicine

## 2016-05-14 ENCOUNTER — Encounter (HOSPITAL_COMMUNITY): Payer: Self-pay | Admitting: *Deleted

## 2016-05-14 DIAGNOSIS — O24415 Gestational diabetes mellitus in pregnancy, controlled by oral hypoglycemic drugs: Secondary | ICD-10-CM

## 2016-05-14 DIAGNOSIS — O24425 Gestational diabetes mellitus in childbirth, controlled by oral hypoglycemic drugs: Secondary | ICD-10-CM | POA: Diagnosis present

## 2016-05-14 DIAGNOSIS — Z8249 Family history of ischemic heart disease and other diseases of the circulatory system: Secondary | ICD-10-CM

## 2016-05-14 DIAGNOSIS — O99210 Obesity complicating pregnancy, unspecified trimester: Secondary | ICD-10-CM

## 2016-05-14 DIAGNOSIS — O99214 Obesity complicating childbirth: Secondary | ICD-10-CM | POA: Diagnosis present

## 2016-05-14 DIAGNOSIS — O24429 Gestational diabetes mellitus in childbirth, unspecified control: Secondary | ICD-10-CM | POA: Diagnosis not present

## 2016-05-14 DIAGNOSIS — Z6841 Body Mass Index (BMI) 40.0 and over, adult: Secondary | ICD-10-CM

## 2016-05-14 DIAGNOSIS — O135 Gestational [pregnancy-induced] hypertension without significant proteinuria, complicating the puerperium: Secondary | ICD-10-CM | POA: Diagnosis not present

## 2016-05-14 DIAGNOSIS — Z833 Family history of diabetes mellitus: Secondary | ICD-10-CM | POA: Diagnosis not present

## 2016-05-14 DIAGNOSIS — O98213 Gonorrhea complicating pregnancy, third trimester: Secondary | ICD-10-CM

## 2016-05-14 DIAGNOSIS — R000 Tachycardia, unspecified: Secondary | ICD-10-CM | POA: Diagnosis not present

## 2016-05-14 DIAGNOSIS — Z3A38 38 weeks gestation of pregnancy: Secondary | ICD-10-CM

## 2016-05-14 DIAGNOSIS — O134 Gestational [pregnancy-induced] hypertension without significant proteinuria, complicating childbirth: Secondary | ICD-10-CM | POA: Diagnosis present

## 2016-05-14 DIAGNOSIS — O133 Gestational [pregnancy-induced] hypertension without significant proteinuria, third trimester: Secondary | ICD-10-CM

## 2016-05-14 DIAGNOSIS — O0993 Supervision of high risk pregnancy, unspecified, third trimester: Secondary | ICD-10-CM

## 2016-05-14 LAB — COMPREHENSIVE METABOLIC PANEL
ALK PHOS: 158 U/L — AB (ref 38–126)
ALT: 10 U/L — AB (ref 14–54)
AST: 14 U/L — ABNORMAL LOW (ref 15–41)
Albumin: 2.7 g/dL — ABNORMAL LOW (ref 3.5–5.0)
Anion gap: 7 (ref 5–15)
BUN: 14 mg/dL (ref 6–20)
CALCIUM: 9 mg/dL (ref 8.9–10.3)
CO2: 19 mmol/L — ABNORMAL LOW (ref 22–32)
CREATININE: 0.78 mg/dL (ref 0.44–1.00)
Chloride: 110 mmol/L (ref 101–111)
Glucose, Bld: 171 mg/dL — ABNORMAL HIGH (ref 65–99)
Potassium: 4.3 mmol/L (ref 3.5–5.1)
Sodium: 136 mmol/L (ref 135–145)
Total Bilirubin: <0.1 mg/dL — ABNORMAL LOW (ref 0.3–1.2)
Total Protein: 6.4 g/dL — ABNORMAL LOW (ref 6.5–8.1)

## 2016-05-14 LAB — CBC WITH DIFFERENTIAL/PLATELET
Basophils Absolute: 0 10*3/uL (ref 0.0–0.1)
Basophils Relative: 0 %
EOS PCT: 1 %
Eosinophils Absolute: 0.1 10*3/uL (ref 0.0–0.7)
HCT: 31.6 % — ABNORMAL LOW (ref 36.0–46.0)
HEMOGLOBIN: 10.4 g/dL — AB (ref 12.0–15.0)
LYMPHS ABS: 2.1 10*3/uL (ref 0.7–4.0)
LYMPHS PCT: 25 %
MCH: 26.9 pg (ref 26.0–34.0)
MCHC: 32.9 g/dL (ref 30.0–36.0)
MCV: 81.9 fL (ref 78.0–100.0)
MONOS PCT: 4 %
Monocytes Absolute: 0.3 10*3/uL (ref 0.1–1.0)
Neutro Abs: 6 10*3/uL (ref 1.7–7.7)
Neutrophils Relative %: 70 %
PLATELETS: 209 10*3/uL (ref 150–400)
RBC: 3.86 MIL/uL — ABNORMAL LOW (ref 3.87–5.11)
RDW: 14.2 % (ref 11.5–15.5)
WBC: 8.6 10*3/uL (ref 4.0–10.5)

## 2016-05-14 LAB — CBC
HEMATOCRIT: 29.8 % — AB (ref 36.0–46.0)
Hemoglobin: 9.8 g/dL — ABNORMAL LOW (ref 12.0–15.0)
MCH: 26.6 pg (ref 26.0–34.0)
MCHC: 32.9 g/dL (ref 30.0–36.0)
MCV: 80.8 fL (ref 78.0–100.0)
PLATELETS: 199 10*3/uL (ref 150–400)
RBC: 3.69 MIL/uL — ABNORMAL LOW (ref 3.87–5.11)
RDW: 14.1 % (ref 11.5–15.5)
WBC: 8.8 10*3/uL (ref 4.0–10.5)

## 2016-05-14 LAB — URINALYSIS, ROUTINE W REFLEX MICROSCOPIC
BILIRUBIN URINE: NEGATIVE
Glucose, UA: NEGATIVE mg/dL
Hgb urine dipstick: NEGATIVE
Ketones, ur: NEGATIVE mg/dL
Nitrite: NEGATIVE
Protein, ur: 100 mg/dL — AB
SPECIFIC GRAVITY, URINE: 1.025 (ref 1.005–1.030)
pH: 5 (ref 5.0–8.0)

## 2016-05-14 LAB — OB RESULTS CONSOLE GBS: GBS: NEGATIVE

## 2016-05-14 LAB — PROTEIN / CREATININE RATIO, URINE
CREATININE, URINE: 249 mg/dL
Protein Creatinine Ratio: 0.21 mg/mg{Cre} — ABNORMAL HIGH (ref 0.00–0.15)
TOTAL PROTEIN, URINE: 53 mg/dL

## 2016-05-14 LAB — GROUP B STREP BY PCR: GROUP B STREP BY PCR: NEGATIVE

## 2016-05-14 LAB — GLUCOSE, CAPILLARY: Glucose-Capillary: 92 mg/dL (ref 65–99)

## 2016-05-14 MED ORDER — ACETAMINOPHEN 325 MG PO TABS: 650.0000 mg | ORAL_TABLET | ORAL | Status: DC | PRN

## 2016-05-14 MED ORDER — FLEET ENEMA 7-19 GM/118ML RE ENEM: 1.0000 | ENEMA | RECTAL | Status: DC | PRN

## 2016-05-14 MED ORDER — OXYCODONE-ACETAMINOPHEN 5-325 MG PO TABS: 1.0000 | ORAL_TABLET | ORAL | Status: DC | PRN

## 2016-05-14 MED ORDER — ONDANSETRON HCL 4 MG/2ML IJ SOLN
4.0000 mg | Freq: Four times a day (QID) | INTRAMUSCULAR | Status: DC | PRN
  Administered 2016-05-16: 4 mg via INTRAVENOUS
  Filled 2016-05-14: qty 2

## 2016-05-14 MED ORDER — OXYTOCIN 40 UNITS IN LACTATED RINGERS INFUSION - SIMPLE MED
2.5000 [IU]/h | INTRAVENOUS | Status: DC
  Filled 2016-05-14: qty 1000

## 2016-05-14 MED ORDER — SOD CITRATE-CITRIC ACID 500-334 MG/5ML PO SOLN: 30.0000 mL | ORAL | Status: DC | PRN

## 2016-05-14 MED ORDER — MISOPROSTOL 25 MCG QUARTER TABLET
25.0000 ug | ORAL_TABLET | ORAL | Status: DC | PRN
  Administered 2016-05-14 – 2016-05-15 (×2): 25 ug via VAGINAL
  Filled 2016-05-14 (×2): qty 0.25
  Filled 2016-05-14: qty 1

## 2016-05-14 MED ORDER — LACTATED RINGERS IV SOLN
INTRAVENOUS | Status: DC
  Administered 2016-05-15 – 2016-05-16 (×4): via INTRAVENOUS

## 2016-05-14 MED ORDER — LACTATED RINGERS IV SOLN
500.0000 mL | INTRAVENOUS | Status: DC | PRN
  Administered 2016-05-16: 500 mL via INTRAVENOUS

## 2016-05-14 MED ORDER — OXYTOCIN BOLUS FROM INFUSION
500.0000 mL | Freq: Once | INTRAVENOUS | Status: AC
  Administered 2016-05-16: 500 mL via INTRAVENOUS

## 2016-05-14 MED ORDER — OXYCODONE-ACETAMINOPHEN 5-325 MG PO TABS: 2.0000 | ORAL_TABLET | ORAL | Status: DC | PRN

## 2016-05-14 MED ORDER — LIDOCAINE HCL (PF) 1 % IJ SOLN
30.0000 mL | INTRAMUSCULAR | Status: DC | PRN
  Filled 2016-05-14: qty 30

## 2016-05-14 MED ORDER — TERBUTALINE SULFATE 1 MG/ML IJ SOLN
0.2500 mg | Freq: Once | INTRAMUSCULAR | Status: DC | PRN
  Filled 2016-05-14: qty 1

## 2016-05-14 NOTE — MAU Note (Signed)
Took her blood pressure two days ago at the pharmacy and today and said it was elevated.  Says she is seeing white spots and feels like her heart is racing.  Denies LOF/VB/pain.

## 2016-05-14 NOTE — L&D Delivery Note (Signed)
Delivery Note At 12:28 PM a viable female was delivered via Vaginal, Spontaneous Delivery (Presentation:vertex ; LOA  ).  APGAR: 8, 9; weight pending .   Placenta status: delivered with gentle traction.  Cord: 3 vessel with the following complications: none .  Cord pH: none  Anesthesia:  epidural Episiotomy: None Lacerations: 1st degree;Perineal Suture Repair: 3.0 vicryl Est. Blood Loss (mL): 150  Mom to postpartum.  Baby to Couplet care / Skin to Skin.  Ernestina Pennaicholas Schenk 05/16/2016, 12:47 PM

## 2016-05-14 NOTE — H&P (Signed)
LABOR AND DELIVERY ADMISSION HISTORY AND PHYSICAL NOTE  Cheryl Boyle is a 31 y.o. female G1P0000 with IUP at 3246w0d by US presenting with heart racing and some vision changes earlier today. Now she denies any vision changes or headache.  Of note she was at the pharmacy two days ago and noted that her BP was elevated. BP elevated to 148/80 in the MAU.   She reports positive fetal movement. She denies leakage of fluid or vaginal bleeding.  Prenatal History/Complications:  Past Medical History: Past Medical History:  Diagnosis Date  . Diabetes mellitus without complication (HCC)   . Gestational diabetes     Past Surgical History: Past Surgical History:  Procedure Laterality Date  . NO PAST SURGERIES      Obstetrical History: OB History    Gravida Para Term Preterm AB Living   1 0 0 0 0     SAB TAB Ectopic Multiple Live Births   0 0 0          Social History: Social History   Social History  . Marital status: Significant Other    Spouse name: N/A  . Number of children: N/A  . Years of education: N/A   Social History Main Topics  . Smoking status: Never Smoker  . Smokeless tobacco: Never Used  . Alcohol use No  . Drug use: No  . Sexual activity: Yes   Other Topics Concern  . Not on file   Social History Narrative   ** Merged History Encounter **        Family History: Family History  Problem Relation Age of Onset  . Diabetes Mother   . Heart disease Mother     Allergies: Allergies  Allergen Reactions  . Influenza Vaccines Swelling    Swelling and high fever    Prescriptions Prior to Admission  Medication Sig Dispense Refill Last Dose  . aspirin 81 MG chewable tablet Chew 1 tablet (81 mg total) by mouth daily. 30 tablet 2 Past Week at Unknown time  . glyBURIDE (DIABETA) 2.5 MG tablet Take 1-2 tablets (2.5-5 mg total) by mouth 2 (two) times daily with a meal. 2 tabs q am, 1 tab q hs (Patient taking differently: Take 2.5-5 mg by mouth daily with  breakfast. ) 60 tablet 3 05/13/2016 at Unknown time  . Prenatal Vit-Fe Fumarate-FA (PRENATAL MULTIVITAMIN) TABS tablet Take 1 tablet by mouth daily at 12 noon.   05/14/2016 at Unknown time     Review of Systems   All systems reviewed and negative except as stated in HPI  Blood pressure 148/82, pulse 79, temperature 97.9 F (36.6 C), temperature source Oral, resp. rate 18, last menstrual period 08/22/2015. General appearance: alert and cooperative Lungs: clear to auscultation bilaterally Heart: regular rate and rhythm Abdomen: soft, non-tender; bowel sounds normal Extremities: No calf swelling or tenderness Presentation: cephalic vertex Fetal monitoring: category 1 Uterine activity: no contractions     Prenatal labs: ABO, Rh: O/POS/-- (07/12 1357) Antibody: NEG (07/12 1357) Rubella: 2.55 immune RPR: NON REAC (11/08 0931)  HBsAg: NEGATIVE (07/12 1357)  HIV: NONREACTIVE (11/08 0931)  GBS:   pending 1 hr Glucola: 175--->3hr-abnormal Genetic screening:  Normal Anatomy US: Normal  Prenatal Transfer Tool  Maternal Diabetes: Yes:  Diabetes Type:  Insulin/Medication controlled Genetic Screening: Normal Maternal Ultrasounds/Referrals: Normal Fetal Ultrasounds or other Referrals:  Referred to Materal Fetal Medicine  Maternal Substance Abuse:  No Significant Maternal Medications:  None Significant Maternal Lab Results: None  Results for orders placed  or performed during the hospital encounter of 05/14/16 (from the past 24 hour(s))  Protein / creatinine ratio, urine   Collection Time: 05/14/16  3:27 PM  Result Value Ref Range   Creatinine, Urine 249.00 mg/dL   Total Protein, Urine 53 mg/dL   Protein Creatinine Ratio 0.21 (H) 0.00 - 0.15 mg/mg[Cre]  Urinalysis, Routine w reflex microscopic   Collection Time: 05/14/16  3:27 PM  Result Value Ref Range   Color, Urine YELLOW YELLOW   APPearance CLOUDY (A) CLEAR   Specific Gravity, Urine 1.025 1.005 - 1.030   pH 5.0 5.0 - 8.0    Glucose, UA NEGATIVE NEGATIVE mg/dL   Hgb urine dipstick NEGATIVE NEGATIVE   Bilirubin Urine NEGATIVE NEGATIVE   Ketones, ur NEGATIVE NEGATIVE mg/dL   Protein, ur 161 (A) NEGATIVE mg/dL   Nitrite NEGATIVE NEGATIVE   Leukocytes, UA TRACE (A) NEGATIVE   RBC / HPF 6-30 0 - 5 RBC/hpf   WBC, UA TOO NUMEROUS TO COUNT 0 - 5 WBC/hpf   Bacteria, UA RARE (A) NONE SEEN   Squamous Epithelial / LPF 6-30 (A) NONE SEEN   Mucous PRESENT   CBC with Differential   Collection Time: 05/14/16  3:32 PM  Result Value Ref Range   WBC 8.6 4.0 - 10.5 K/uL   RBC 3.86 (L) 3.87 - 5.11 MIL/uL   Hemoglobin 10.4 (L) 12.0 - 15.0 g/dL   HCT 09.6 (L) 04.5 - 40.9 %   MCV 81.9 78.0 - 100.0 fL   MCH 26.9 26.0 - 34.0 pg   MCHC 32.9 30.0 - 36.0 g/dL   RDW 81.1 91.4 - 78.2 %   Platelets 209 150 - 400 K/uL   Neutrophils Relative % 70 %   Neutro Abs 6.0 1.7 - 7.7 K/uL   Lymphocytes Relative 25 %   Lymphs Abs 2.1 0.7 - 4.0 K/uL   Monocytes Relative 4 %   Monocytes Absolute 0.3 0.1 - 1.0 K/uL   Eosinophils Relative 1 %   Eosinophils Absolute 0.1 0.0 - 0.7 K/uL   Basophils Relative 0 %   Basophils Absolute 0.0 0.0 - 0.1 K/uL  Comprehensive metabolic panel   Collection Time: 05/14/16  3:32 PM  Result Value Ref Range   Sodium 136 135 - 145 mmol/L   Potassium 4.3 3.5 - 5.1 mmol/L   Chloride 110 101 - 111 mmol/L   CO2 19 (L) 22 - 32 mmol/L   Glucose, Bld 171 (H) 65 - 99 mg/dL   BUN 14 6 - 20 mg/dL   Creatinine, Ser 9.56 0.44 - 1.00 mg/dL   Calcium 9.0 8.9 - 21.3 mg/dL   Total Protein 6.4 (L) 6.5 - 8.1 g/dL   Albumin 2.7 (L) 3.5 - 5.0 g/dL   AST 14 (L) 15 - 41 U/L   ALT 10 (L) 14 - 54 U/L   Alkaline Phosphatase 158 (H) 38 - 126 U/L   Total Bilirubin <0.1 (L) 0.3 - 1.2 mg/dL   GFR calc non Af Amer >60 >60 mL/min   GFR calc Af Amer >60 >60 mL/min   Anion gap 7 5 - 15    Patient Active Problem List   Diagnosis Date Noted  . Gonorrhea in pregnancy, antepartum, third trimester 11/25/2015  . Diabetes mellitus  during pregnancy, antepartum 11/25/2015  . Obesity in pregnancy 11/23/2015  . Pregnancy, supervision, high-risk, third trimester 11/23/2015    Assessment: Cheryl Boyle is a 31 y.o. G1P0000 at [redacted]w[redacted]d here for [place] for gHTN  #Labor:start with cytotec and  plan for foley and then pitocin #Pain: Epidural and IV pain medications #FWB: Category 1 #ID:  GBS unknown - PCR sent #MOF: breast #MOC:undecided #Circ:  no # Gestational HTN: monitor BP. Labs WNL. If become severe range will start magnesium. # GDMA2: Random glucose 171. Check CBG q4 hours. If remain elevated consider insulin drip.  Continue glyburide 2.5mg   At bed time and 5mg  in AM  Renne Musca, MD PGY-1 05/14/2016, 5:35 PM  OB FELLOW HISTORY AND PHYSICAL ATTESTATION  I have seen and examined this patient; I agree with above documentation in the resident's note.    Ernestina Penna 05/14/2016, 8:29 PM

## 2016-05-15 ENCOUNTER — Encounter (HOSPITAL_COMMUNITY): Payer: Self-pay

## 2016-05-15 ENCOUNTER — Inpatient Hospital Stay (HOSPITAL_COMMUNITY): Payer: Medicaid Other | Admitting: Anesthesiology

## 2016-05-15 LAB — GLUCOSE, CAPILLARY
Glucose-Capillary: 137 mg/dL — ABNORMAL HIGH (ref 65–99)
Glucose-Capillary: 78 mg/dL (ref 65–99)
Glucose-Capillary: 88 mg/dL (ref 65–99)
Glucose-Capillary: 93 mg/dL (ref 65–99)
Glucose-Capillary: 94 mg/dL (ref 65–99)
Glucose-Capillary: 98 mg/dL (ref 65–99)

## 2016-05-15 LAB — CBC
HCT: 29.4 % — ABNORMAL LOW (ref 36.0–46.0)
Hemoglobin: 9.9 g/dL — ABNORMAL LOW (ref 12.0–15.0)
MCH: 27.3 pg (ref 26.0–34.0)
MCHC: 33.7 g/dL (ref 30.0–36.0)
MCV: 81 fL (ref 78.0–100.0)
Platelets: 198 10*3/uL (ref 150–400)
RBC: 3.63 MIL/uL — AB (ref 3.87–5.11)
RDW: 14.2 % (ref 11.5–15.5)
WBC: 9.7 10*3/uL (ref 4.0–10.5)

## 2016-05-15 LAB — ABO/RH: ABO/RH(D): O POS

## 2016-05-15 LAB — PREPARE RBC (CROSSMATCH)

## 2016-05-15 MED ORDER — FENTANYL 2.5 MCG/ML BUPIVACAINE 1/10 % EPIDURAL INFUSION (WH - ANES)
14.0000 mL/h | INTRAMUSCULAR | Status: DC | PRN
  Administered 2016-05-15 – 2016-05-16 (×2): 14 mL/h via EPIDURAL
  Filled 2016-05-15 (×2): qty 100

## 2016-05-15 MED ORDER — OXYTOCIN 40 UNITS IN LACTATED RINGERS INFUSION - SIMPLE MED
1.0000 m[IU]/min | INTRAVENOUS | Status: DC
  Administered 2016-05-15: 2 m[IU]/min via INTRAVENOUS
  Administered 2016-05-15: 6 m[IU]/min via INTRAVENOUS
  Administered 2016-05-16: 14 m[IU]/min via INTRAVENOUS

## 2016-05-15 MED ORDER — PHENYLEPHRINE 40 MCG/ML (10ML) SYRINGE FOR IV PUSH (FOR BLOOD PRESSURE SUPPORT)
80.0000 ug | PREFILLED_SYRINGE | INTRAVENOUS | Status: DC | PRN
  Filled 2016-05-15: qty 5

## 2016-05-15 MED ORDER — PHENYLEPHRINE 40 MCG/ML (10ML) SYRINGE FOR IV PUSH (FOR BLOOD PRESSURE SUPPORT)
80.0000 ug | PREFILLED_SYRINGE | INTRAVENOUS | Status: DC | PRN
  Filled 2016-05-15: qty 10
  Filled 2016-05-15: qty 5

## 2016-05-15 MED ORDER — EPHEDRINE 5 MG/ML INJ
10.0000 mg | INTRAVENOUS | Status: DC | PRN
  Filled 2016-05-15: qty 4

## 2016-05-15 MED ORDER — FENTANYL 2.5 MCG/ML BUPIVACAINE 1/10 % EPIDURAL INFUSION (WH - ANES): 14.0000 mL/h | INTRAMUSCULAR | Status: DC | PRN

## 2016-05-15 MED ORDER — LACTATED RINGERS IV SOLN: 500.0000 mL | Freq: Once | INTRAVENOUS | Status: DC

## 2016-05-15 MED ORDER — FENTANYL CITRATE (PF) 100 MCG/2ML IJ SOLN
INTRAMUSCULAR | Status: AC
  Administered 2016-05-15: 100 ug via INTRAVENOUS
  Filled 2016-05-15: qty 2

## 2016-05-15 MED ORDER — FENTANYL CITRATE (PF) 100 MCG/2ML IJ SOLN
50.0000 ug | INTRAMUSCULAR | Status: DC | PRN
  Administered 2016-05-15 (×2): 100 ug via INTRAVENOUS
  Administered 2016-05-15 (×4): 50 ug via INTRAVENOUS
  Filled 2016-05-15 (×6): qty 2

## 2016-05-15 MED ORDER — DIPHENHYDRAMINE HCL 50 MG/ML IJ SOLN: 12.5000 mg | INTRAMUSCULAR | Status: DC | PRN

## 2016-05-15 MED ORDER — LIDOCAINE HCL (PF) 1 % IJ SOLN
INTRAMUSCULAR | Status: DC | PRN
Start: 2016-05-15 — End: 2016-05-16
  Administered 2016-05-15 (×2): 4 mL

## 2016-05-15 MED ORDER — SODIUM CHLORIDE 0.9 % IV SOLN: Freq: Once | INTRAVENOUS | Status: DC

## 2016-05-15 MED ORDER — GLYBURIDE 2.5 MG PO TABS
2.5000 mg | ORAL_TABLET | Freq: Every day | ORAL | Status: DC
Start: 2016-05-15 — End: 2016-05-16
  Filled 2016-05-15 (×3): qty 1

## 2016-05-15 MED ORDER — TERBUTALINE SULFATE 1 MG/ML IJ SOLN
0.2500 mg | Freq: Once | INTRAMUSCULAR | Status: DC | PRN
  Filled 2016-05-15: qty 1

## 2016-05-15 NOTE — Progress Notes (Signed)
LCSW aware of consult for MOB regarding "Help to see if she will qualify for [facility]".  MOB will have to apply as SW cannot assist with this process while in hospital. If MOB has questions regarding process or where to go, LCSW can provide information.    To participate in [facility], persons must be income eligible and have a qualifying medical or nutritional need. The eligibility guidelines are listed below:  Live in [place]. Be a pregnant woman; a postpartum woman who has had a baby in the last 6 months; a breastfeeding woman who has had a baby in the last 12 months; an infant or child up to age 79five. Be income eligible. Income is based on the size of the family unit and the total household income. Persons participating in [place], [employer] Stamps, or [employer] are already income eligible.  Have a qualifying medical or nutritional need as determined by a [facility] Nutritionist.    If additional needs arise, please re-consult or call.  Deretha EmoryHannah Ahmiya Abee LCSW, MSW Clinical Social Work: Optician, dispensingystem Wide Float Coverage for :

## 2016-05-15 NOTE — Progress Notes (Signed)
Stopped by to see patient. She is complaining of 5/10 back pain. Requesting IV pain medication.  Dr. Omer JackMumaw to see shortly to place foley bulb. [id number] since last cervical check 1.5cm at that time.  Contractions every 1-2 min with occasional pauses. Will continue to monitor.

## 2016-05-15 NOTE — Progress Notes (Addendum)
Patient ID: Cheryl CrutchHanane Beichner, female   DOB: 12/31/1985, 31 y.o.   MRN: [id number]  S: Patient seen & examined for progress of labor. Patient comfortable.    O:  Vitals:   05/15/16 1717 05/15/16 1721 05/15/16 1807 05/15/16 1911  BP:  (!) 120/49 131/75 106/67  Pulse:  83 82 76  Resp: 16 16 16 18   Temp: 98.3 F (36.8 C)     TempSrc: Oral     Weight:      Height:        Dilation: 4.5 Effacement (%): 70 Cervical Position: Middle Station: -3 Presentation: Vertex Exam by:: Dr. Omer JackMumaw  AROM performed, clear fluid return. Patient and baby tolerated procedure well.  FHT: 135 bpm, mod var, +accels, no decels TOCO: q2-704min   A/P: AROM performed Continue pitocin CBG controlled BP controlled Continue expectant management Anticipate SVD

## 2016-05-15 NOTE — Anesthesia Preprocedure Evaluation (Signed)
Anesthesia Evaluation  Patient identified by MRN, date of birth, ID band Patient awake    Reviewed: Allergy & Precautions, NPO status , Patient's Chart, lab work & pertinent test results  Airway Mallampati: III  TM Distance: >3 FB Neck ROM: Full    Dental no notable dental hx.    Pulmonary neg pulmonary ROS,    Pulmonary exam normal breath sounds clear to auscultation       Cardiovascular negative cardio ROS Normal cardiovascular exam Rhythm:Regular Rate:Normal     Neuro/Psych negative neurological ROS  negative psych ROS   GI/Hepatic negative GI ROS, Neg liver ROS,   Endo/Other  diabetes, Type 2, Oral Hypoglycemic AgentsMorbid obesity  Renal/GU negative Renal ROS     Musculoskeletal negative musculoskeletal ROS (+)   Abdominal (+) + obese,   Peds  Hematology negative hematology ROS (+)   Anesthesia Other Findings   Reproductive/Obstetrics (+) Pregnancy                             Anesthesia Physical Anesthesia Plan  ASA: III  Anesthesia Plan: Epidural   Post-op Pain Management:    Induction:   Airway Management Planned:   Additional Equipment:   Intra-op Plan:   Post-operative Plan:   Informed Consent: I have reviewed the patients History and Physical, chart, labs and discussed the procedure including the risks, benefits and alternatives for the proposed anesthesia with the patient or authorized representative who has indicated his/her understanding and acceptance.     Plan Discussed with:   Anesthesia Plan Comments:         Anesthesia Quick Evaluation

## 2016-05-15 NOTE — Progress Notes (Signed)
Patient ID: Meryl CrutchHanane Liford, female   DOB: 02/08/1986, 31 y.o.   MRN: [id number]  S: Patient seen & examined for progress of labor. Patient comfortable with IV fentanyl prn.    O:  Vitals:   05/15/16 1002 05/15/16 1007 05/15/16 1101 05/15/16 1156  BP: (!) 143/74   (!) 151/80  Pulse: 77   79  Resp:   14 16  Temp:  98.3 F (36.8 C)    TempSrc:  Axillary    Weight:      Height:        Dilation: 1.5 Effacement (%): 90 (with foley bulb) Cervical Position: Anterior Station: Ballotable Presentation: Vertex Exam by:: Kimiko Common, MD Foley bulb is still in place.   FHT: 130 bpm, mod var, +accels, no decels TOCO: Irregular about q2-4 min.   A/P: Foley bulb still in place May start low dose pitocin if contracting too frequently for cytotec. Continue expectant management Anticipate SVD

## 2016-05-15 NOTE — Progress Notes (Signed)
S: Patient seen & examined for progress of labor. Patient comfortable with IV fetanyl. Pain well controlled.    O:  Vitals:   05/15/16 1553 05/15/16 1717 05/15/16 1721 05/15/16 1807  BP: (!) 103/52  (!) 120/49 131/75  Pulse: 79  83 82  Resp: 16 16 16 16   Temp:  98.3 F (36.8 C)    TempSrc:  Oral    Weight:      Height:        Dilation: 5 Effacement (%): 70 Cervical Position: Anterior Station: -3 Presentation: Vertex Exam by:: Susie Nix. RN   FHT: 140bpm, mod var, +accels, no decels TOCO: regular q2-173min   A/P: Foley bulb removed at 530PM pit at 6 Continue expectant management Anticipate SVD

## 2016-05-15 NOTE — Anesthesia Pain Management Evaluation Note (Signed)
  CRNA Pain Management Visit Note  Patient: Cheryl Boyle, 31 y.o., female  "Hello [facility]. We have an anesthesia team available at all times to provide care throughout the hospital, including epidural management and anesthesia for C-section. I don't know your plan for the delivery whether it a natural birth, water birth, IV sedation, nitrous supplementation, doula or epidural, but we want to meet your pain goals."   1.Was your pain managed to your expectations on prior hospitalizations?   Yes   2.What is your expectation for pain management during this hospitalization?     Epidural and IV pain meds  3.How can we help you reach that goal? Epidural, IV meds  Record the patient's initial score and the patient's pain goal.  Pain: 5/10  Pain Goal: 0/10  [facility] wants you to be able to say your pain was always managed very well.  Cheryl Boyle, Cheryl Boyle 05/15/2016

## 2016-05-15 NOTE — Progress Notes (Signed)
Patient ID: Cheryl Boyle, female   DOB: 06/05/1985, [age] y.o.   MRN: [id number]  Cheryl: Patient seen & examined for progress of labor. Patient comfortable in bed.    O:  Vitals:   05/15/16 0520 05/15/16 0655 05/15/16 0756 05/15/16 0914  BP: (!) 143/75 136/66 109/71 (!) 143/66  Pulse: 75 82 84 94  Resp:  16 18 (!) 22  Temp:  98.3 F (36.8 C)    TempSrc:  Oral    Weight:      Height:        Dilation: 1.5 Effacement (%): Thick Station: Ballotable Presentation: Vertex Exam by:: Cheryl Boyle  Foley bulb placed without difficulty. Cervix is anterior. 60 cc saline inflated into the foley bulb.  FHT: 145 bpm, mod var, +accels, no decels TOCO: q422min   A/P: Foley bulb placed Contracting too frequently for another cytotec, will hold off, may start pitocin if FB still in place in 4 hours Continue expectant management Anticipate SVD

## 2016-05-15 NOTE — Anesthesia Procedure Notes (Signed)
Epidural Patient location during procedure: OB Start time: 05/15/2016 8:50 PM End time: 05/15/2016 9:02 PM  Staffing Anesthesiologist: Lewie LoronGERMEROTH, Bettina Warn Performed: anesthesiologist   Preanesthetic Checklist Completed: patient identified, pre-op evaluation, timeout performed, IV checked, risks and benefits discussed and monitors and equipment checked  Epidural Patient position: sitting Prep: site prepped and draped and DuraPrep Patient monitoring: heart rate Approach: midline Location: L3-L4 Injection technique: LOR air and LOR saline  Needle:  Needle type: Tuohy  Needle gauge: 17 G Needle length: 9 cm Needle insertion depth: 9 cm Catheter type: closed end flexible Catheter size: 19 Gauge Catheter at skin depth: 15 cm Test dose: negative  Assessment Sensory level: T8 Events: blood not aspirated, injection not painful, no injection resistance, negative IV test and no paresthesia  Additional Notes Reason for block:procedure for pain

## 2016-05-15 NOTE — Progress Notes (Signed)
Patient seen. Attempted Foley. Unable to place. Patient contracting every 2 minutes. Continue to monitor. Place Cytotec if contractions space out. attempt foley in AM

## 2016-05-16 ENCOUNTER — Encounter (HOSPITAL_COMMUNITY): Payer: Self-pay

## 2016-05-16 ENCOUNTER — Other Ambulatory Visit: Payer: Self-pay | Admitting: Family Medicine

## 2016-05-16 DIAGNOSIS — Z3A38 38 weeks gestation of pregnancy: Secondary | ICD-10-CM

## 2016-05-16 DIAGNOSIS — O24429 Gestational diabetes mellitus in childbirth, unspecified control: Secondary | ICD-10-CM

## 2016-05-16 DIAGNOSIS — O135 Gestational [pregnancy-induced] hypertension without significant proteinuria, complicating the puerperium: Secondary | ICD-10-CM

## 2016-05-16 LAB — GLUCOSE, CAPILLARY
Glucose-Capillary: 111 mg/dL — ABNORMAL HIGH (ref 65–99)
Glucose-Capillary: 111 mg/dL — ABNORMAL HIGH (ref 65–99)
Glucose-Capillary: 125 mg/dL — ABNORMAL HIGH (ref 65–99)
Glucose-Capillary: 84 mg/dL (ref 65–99)

## 2016-05-16 LAB — CBC
HEMATOCRIT: 28.5 % — AB (ref 36.0–46.0)
Hemoglobin: 9.7 g/dL — ABNORMAL LOW (ref 12.0–15.0)
MCH: 27.6 pg (ref 26.0–34.0)
MCHC: 34 g/dL (ref 30.0–36.0)
MCV: 81 fL (ref 78.0–100.0)
Platelets: 190 10*3/uL (ref 150–400)
RBC: 3.52 MIL/uL — ABNORMAL LOW (ref 3.87–5.11)
RDW: 14.4 % (ref 11.5–15.5)
WBC: 16 10*3/uL — AB (ref 4.0–10.5)

## 2016-05-16 LAB — RPR: RPR: NONREACTIVE

## 2016-05-16 MED ORDER — WITCH HAZEL-GLYCERIN EX PADS: 1.0000 "application " | MEDICATED_PAD | CUTANEOUS | Status: DC | PRN

## 2016-05-16 MED ORDER — ACETAMINOPHEN 325 MG PO TABS
650.0000 mg | ORAL_TABLET | ORAL | Status: DC | PRN
Start: 2016-05-16 — End: 2016-05-18
  Administered 2016-05-17 (×2): 650 mg via ORAL
  Filled 2016-05-16 (×3): qty 2

## 2016-05-16 MED ORDER — SIMETHICONE 80 MG PO CHEW: 80.0000 mg | CHEWABLE_TABLET | ORAL | Status: DC | PRN

## 2016-05-16 MED ORDER — ONDANSETRON HCL 4 MG PO TABS
4.0000 mg | ORAL_TABLET | ORAL | Status: DC | PRN
Start: 2016-05-16 — End: 2016-05-18

## 2016-05-16 MED ORDER — DIBUCAINE 1 % RE OINT: 1.0000 "application " | TOPICAL_OINTMENT | RECTAL | Status: DC | PRN

## 2016-05-16 MED ORDER — COCONUT OIL OIL
1.0000 "application " | TOPICAL_OIL | Status: DC | PRN
  Administered 2016-05-16: 1 via TOPICAL
  Filled 2016-05-16: qty 120

## 2016-05-16 MED ORDER — TETANUS-DIPHTH-ACELL PERTUSSIS 5-2.5-18.5 LF-MCG/0.5 IM SUSP: 0.5000 mL | Freq: Once | INTRAMUSCULAR | Status: DC

## 2016-05-16 MED ORDER — ONDANSETRON HCL 4 MG/2ML IJ SOLN: 4.0000 mg | INTRAMUSCULAR | Status: DC | PRN

## 2016-05-16 MED ORDER — DIPHENHYDRAMINE HCL 25 MG PO CAPS: 25.0000 mg | ORAL_CAPSULE | Freq: Four times a day (QID) | ORAL | Status: DC | PRN

## 2016-05-16 MED ORDER — ZOLPIDEM TARTRATE 5 MG PO TABS: 5.0000 mg | ORAL_TABLET | Freq: Every evening | ORAL | Status: DC | PRN

## 2016-05-16 MED ORDER — IBUPROFEN 600 MG PO TABS
600.0000 mg | ORAL_TABLET | Freq: Four times a day (QID) | ORAL | Status: DC
  Administered 2016-05-16 – 2016-05-18 (×8): 600 mg via ORAL
  Filled 2016-05-16 (×8): qty 1

## 2016-05-16 MED ORDER — SENNOSIDES-DOCUSATE SODIUM 8.6-50 MG PO TABS
2.0000 | ORAL_TABLET | ORAL | Status: DC
  Administered 2016-05-16 – 2016-05-18 (×2): 2 via ORAL
  Filled 2016-05-16 (×2): qty 2

## 2016-05-16 MED ORDER — BENZOCAINE-MENTHOL 20-0.5 % EX AERO: 1.0000 "application " | INHALATION_SPRAY | CUTANEOUS | Status: DC | PRN

## 2016-05-16 MED ORDER — PRENATAL MULTIVITAMIN CH
1.0000 | ORAL_TABLET | Freq: Every day | ORAL | Status: DC
  Administered 2016-05-17 – 2016-05-18 (×2): 1 via ORAL
  Filled 2016-05-16 (×2): qty 1

## 2016-05-16 NOTE — Progress Notes (Signed)
Cheryl Kaissiis a 31 y.o.G1P0000 at 5136w2d presented for IOL gHTN   Subjective: Patient doing well. Still no pain. Not feeling her contractions at all.    Objective: BP 135/90   Pulse (!) 102   Temp 98.5 F (36.9 C) (Oral)   Resp 20   Ht 5\' 8"  (1.727 m)   Wt 130.6 kg (288 lb)   LMP 08/22/2015 (Exact Date)   SpO2 98%   BMI 43.79 kg/m  No intake/output data recorded. No intake/output data recorded.  FHT:  FHR: 130 bpm, variability: moderate,  accelerations:  Present,  decelerations:  Present variable UC:   regular, every 1-3 minutes SVE:   Dilation: 9 Effacement (%): 80 Station: 0 Exam by:: Dr. Jonathon JordanGambino  Labs: Lab Results  Component Value Date   WBC 9.7 05/15/2016   HGB 9.9 (L) 05/15/2016   HCT 29.4 (L) 05/15/2016   MCV 81.0 05/15/2016   PLT 198 05/15/2016    Assessment / Plan: Induction of labor due to gestational hypertension,  progressing well   Labor: Progressing normally. Tachycardia improved after bolus Fetal Wellbeing:  Category I Pain Control:  Epidural I/D:  GBS neg Anticipated MOD:  NSVD  Cheryl DinningChristina M Boyle 05/16/2016, 8:44 AM

## 2016-05-16 NOTE — Progress Notes (Addendum)
Cheryl Boyle is a 31 y.o. G1P0000 at [redacted]w[redacted]d presented for IOL gHTN   Subjective: Patient doing well. Still no pain. Not feeling her contractions at all.   Objective: BP 118/65   Pulse (!) 106   Temp 98.8 F (37.1 C) (Oral)   Resp 14   Ht 5\' 8"  (1.727 m)   Wt 130.6 kg (288 lb)   LMP 08/22/2015 (Exact Date)   SpO2 100%   BMI 43.79 kg/m  No intake/output data recorded. No intake/output data recorded.  FHT:  FHR: 145 bpm, variability: moderate,  accelerations:  Present,  decelerations:  Present Late, variable UC:   Difficult to track on toco SVE:   Dilation: 6.5 Effacement (%): 90 Station: -1 Exam by:: Dr. Jonathon JordanGambino; Irving BurtonEmily Rothermel RN   Labs: Lab Results  Component Value Date   WBC 9.7 05/15/2016   HGB 9.9 (L) 05/15/2016   HCT 29.4 (L) 05/15/2016   MCV 81.0 05/15/2016   PLT 198 05/15/2016    Assessment / Plan: Induction of labor due to gestational hypertension,  progressing well on pitocin  Labor: Progressing well on Pitocin although having some late decelerations despite repositioning. Additionally contractions are not well traced. IUPC placed by Joellyn HaffKim Booker, CNM for better tracing.  If any decelerations continue, will decrease Pitocin. Will continue to monitor closely. Fetal Wellbeing:  Category II Pain Control:  Epidural I/D:  GBS negative Anticipated MOD:  NSVD  Beaulah DinningChristina M Boyle 05/16/2016, 2:41 AM

## 2016-05-16 NOTE — Progress Notes (Signed)
Arnella Kaissiis a 31 y.o.G1P0000 at 6478w2d presented for IOL gHTN   Subjective: Patient doing well. Still no pain. Not feeling her contractions at all. States she feels a little tired and would like some water.   Objective: BP 121/90   Pulse (!) 134   Temp 98.6 F (37 C) (Oral)   Resp 16   Ht 5\' 8"  (1.727 m)   Wt 130.6 kg (288 lb)   LMP 08/22/2015 (Exact Date)   SpO2 98%   BMI 43.79 kg/m  No intake/output data recorded. No intake/output data recorded.  FHT:  FHR: 135 bpm, variability: moderate,  accelerations:  Present,  decelerations:  Absent UC:   regular, every 2-4 minutes SVE:   Dilation: 8 Effacement (%): 90 Station: 0 Exam by:: Dr. Jonathon JordanGambino; Irving BurtonEmily Rothermel RN   Labs: Lab Results  Component Value Date   WBC 9.7 05/15/2016   HGB 9.9 (L) 05/15/2016   HCT 29.4 (L) 05/15/2016   MCV 81.0 05/15/2016   PLT 198 05/15/2016    Assessment / Plan: Induction of labor due to gestational hypertension,  progressing well on pitocin  Labor: Progressing on Pitocin. Had a few late decels from 3:30-4:30 AM but none in the last hour after Pitocin was reduced. Patient tachycardic to 130-140 range over last hour as well. No fever. Will give 500 cc bolus and continue to monitor.  Fetal Wellbeing:  Category I Pain Control:  Epidural I/D:  GBS neg Anticipated MOD:  NSVD  Beaulah DinningChristina M Marlisha Vanwyk 05/16/2016, 5:29 AM

## 2016-05-16 NOTE — Lactation Note (Addendum)
This note was copied from a baby's chart. Lactation Consultation Note  Patient Name: Cheryl Boyle CrutchHanane Schwalbe AVWUJ'WToday's Date: 05/16/2016 Reason for consult: Initial assessment   Initial consult with first time mom [facility] < 1 [facility]. Mom is [place] but speaks and understands English well. MGM is at bedside assisting mom.   Mom with large compressible breasts and areola with semi flat nipples. Nipples short shaft and flat at rest, everts with stimulation. Colostrum expressible from both breasts. Mom reports + breast changes with pregnancy.   Infant initially would not latch and was fussy. Assisted mom in latching infant to left breast in the laid back cross cradle hold. Infant latched after a few tries with flanged lips, rhythmic suckles and intermittent swallows. Enc mom to massage breast with feeding. Mom was shown how to hand express and colostrum was noted to both breasts. Mom's breasts/nipples were very tender with latch and with hand expression. fter 15 minutes, infant was latched to right breast and was actively feeding when I left the room. GM kept giving infant breathing space, cautioned mom to not give breathing space and to reposition infant head to clear nose.   BF Basics, positioning, pillow support, colostrum, milk coming to volume, stomach size and hand expression reviewed. Enc mom to feed infant 8-12 x in 24 hours at first feeding cues. Enc mom to hand express prior to each latch and after feeding to apply to nipples. BF Resources Handout and LC Brochure given, mom informed of IP/OP Services, BF Support Groups and LC phone #. Mom [facility] aware of [facility], she is a Medicaid pt. Enc her to call WIC post d/c. [facility] given. Enc mom to call out to desk for feeding assistance as needed.      Maternal Data Formula Feeding for Exclusion: No Has patient been taught Hand Expression?: Yes Does the patient have breastfeeding experience prior to this delivery?:  No  Feeding Feeding Type: Breast Fed Length of feed: 20 min  LATCH Score/Interventions Latch: Grasps breast easily, tongue down, lips flanged, rhythmical sucking.  Audible Swallowing: A few with stimulation Intervention(s): Skin to skin;Hand expression;Alternate breast massage  Type of Nipple: Flat Intervention(s): No intervention needed  Comfort (Breast/Nipple): Filling, red/small blisters or bruises, mild/mod discomfort  Problem noted: Mild/Moderate discomfort Interventions (Mild/moderate discomfort): Hand massage  Hold (Positioning): Assistance needed to correctly position infant at breast and maintain latch. Intervention(s): Breastfeeding basics reviewed;Support Pillows;Position options;Skin to skin  LATCH Score: 6  Lactation Tools Discussed/Used WIC Program: No   Consult Status Consult Status: Follow-up Date: 05/17/16 Follow-up type: In-patient    Silas FloodSharon S Hice 05/16/2016, 1:27 PM

## 2016-05-16 NOTE — Progress Notes (Signed)
Labor Progress Note Meryl CrutchHanane Grzesiak is a 31 y.o. G1P0000 at 530w2d presented for IOL gHTN  S: Patient doing well. No pain now that she has had her epidural. Only mildly feeling her contractions.  O:  BP 114/69   Pulse 90   Temp 97.9 F (36.6 C) (Oral)   Resp 16   Ht 5\' 8"  (1.727 m)   Wt 130.6 kg (288 lb)   LMP 08/22/2015 (Exact Date)   SpO2 100%   BMI 43.79 kg/m  EFM: 140 HR. 2 decels around 2200, each ~15 seconds. Resolved with reposition. Now Cat 1.   CVE: Dilation: 5 Effacement (%): 80, 70 Cervical Position: Middle Station: -3 Presentation: Vertex Exam by:: Margret ChanceNora Weatherby, RN   A&P: 31 y.o. G1P0000 7630w2d IOL for gHTN #Labor: Progressing well on pitocin  #Pain: Controlled with epidural #FWB: Had 2 late decels around 2200 but resolved with repositioning. Now Cat 1 tracing #GBS negative  Beaulah Dinninghristina M Jamita Mckelvin, MD 12:08 AM

## 2016-05-16 NOTE — Progress Notes (Signed)
Dr. Jonathon JordanGambino at bedside, made aware of patient's tachycardia. Continuous pulse ox on left hand. Palpated pulse same as monitor. IV fluid bolus given, 500 cc lactated ringers. Will continue to monitor.

## 2016-05-16 NOTE — Lactation Note (Signed)
This note was copied from a baby's chart. Lactation Consultation Note  Patient Name: Cheryl Boyle ZOXWR'UToday's Date: 05/16/2016 Reason for consult: Follow-up assessment Baby at 4 hr of life. Mom requested help with latch. Mom  Has large breast with flat nipples. She needs help positioning baby comfortably at the breast. She is reporting bilateral nipple soreness, no skin break down noted. RN to offer coconut oil. Mom has easily expressed colostrum, reviewed nipple care. Discussed baby behavior, feeding frequency, baby belly size, voids, and wt loss. Mom is aware of lactation services and support group. She will call as needed.    Maternal Data Formula Feeding for Exclusion: No Has patient been taught Hand Expression?: Yes Does the patient have breastfeeding experience prior to this delivery?: No  Feeding Feeding Type: Breast Fed Length of feed: 10 min  LATCH Score/Interventions Latch: Repeated attempts needed to sustain latch, nipple held in mouth throughout feeding, stimulation needed to elicit sucking reflex. Intervention(s): Adjust position;Assist with latch;Breast compression  Audible Swallowing: A few with stimulation Intervention(s): Alternate breast massage;Hand expression;Skin to skin  Type of Nipple: Flat Intervention(s): No intervention needed  Comfort (Breast/Nipple): Filling, red/small blisters or bruises, mild/mod discomfort  Problem noted: Mild/Moderate discomfort Interventions (Mild/moderate discomfort): Hand expression (coconut oil )  Hold (Positioning): Assistance needed to correctly position infant at breast and maintain latch. Intervention(s): Position options  LATCH Score: 5  Lactation Tools Discussed/Used WIC Program: No   Consult Status Consult Status: Follow-up Date: 05/17/16 Follow-up type: In-patient    [name] 05/16/2016, 4:37 PM

## 2016-05-17 NOTE — Progress Notes (Signed)
UR chart review completed.  

## 2016-05-17 NOTE — Lactation Note (Signed)
This note was copied from a baby's chart. Lactation Consultation Note  Patient Name: Boy Meryl CrutchHanane Causey ZOXWR'UToday's Date: 05/17/2016 Reason for consult: Follow-up assessment;Difficult latch Follow up visit made.  Baby is awake and showing feeding cues.  Mom would like latch assist.  Positioned baby in football hold on left breast.  Attempted to latch baby without shield but baby fussy and unable to latch.  20 mm nipple shield applied and after a few attempts baby latched and nursed actively.  Nipple shield filled with colostrum when baby came off.  Instructed mom to post pump 4 times per day.  Mom would like to give small amount of formula if baby still hungry after breast.  Instructed to call for concerns/assist.  Maternal Data    Feeding Feeding Type: Breast Fed Length of feed: 15 min  LATCH Score/Interventions Latch: Repeated attempts needed to sustain latch, nipple held in mouth throughout feeding, stimulation needed to elicit sucking reflex. Intervention(s): Adjust position;Assist with latch;Breast massage;Breast compression  Audible Swallowing: A few with stimulation Intervention(s): Hand expression;Alternate breast massage  Type of Nipple: Everted at rest and after stimulation Intervention(s): Double electric pump  Comfort (Breast/Nipple): Soft / non-tender     Hold (Positioning): Assistance needed to correctly position infant at breast and maintain latch. Intervention(s): Breastfeeding basics reviewed;Support Pillows  LATCH Score: 7  Lactation Tools Discussed/Used Tools: Nipple Shields Nipple shield size: 20   Consult Status Consult Status: Follow-up Date: 05/18/16 Follow-up type: In-patient    Huston FoleyMOULDEN, Dixie Coppa S 05/17/2016, 1:12 PM

## 2016-05-17 NOTE — Progress Notes (Signed)
POSTPARTUM PROGRESS NOTE  Post Partum Day 1  Subjective:  Cheryl Boyle is a 31 y.o. G1P1001 425w2d s/p SVD IOL for gHTN gDMA2  No acute events overnight.  Pt denies problems with ambulating, voiding or po intake.  She denies nausea or vomiting.  Pain is well controlled.  She has had flatus. She has had bowel movement.  Lochia Minimal.   Objective: Blood pressure 110/67, pulse 79, temperature 97.8 F (36.6 C), temperature source Oral, resp. rate 16, height 5\' 8"  (1.727 m), weight 130.6 kg (288 lb), last menstrual period 08/22/2015, SpO2 99 %, unknown if currently breastfeeding.  Physical Exam:  General: alert, cooperative and no distress Lochia:normal flow Chest: CTAB Heart: RRR no m/r/g Abdomen: +BS, soft, nontender,  Uterine Fundus: firm DVT Evaluation: No calf swelling or tenderness Extremities: no edema   Recent Labs  05/15/16 1852 05/16/16 1329  HGB 9.9* 9.7*  HCT 29.4* 28.5*    Assessment/Plan:  ASSESSMENT: Cheryl Boyle is a 31 y.o. G1P1001 6825w2d s/p SVD IOL for gHTN gDMA2   Discharge home.  Place order for baby love prior to DC.    LOS: 3 days   Renne Muscaaniel L Warden, MD PGY-1 [facility]  05/17/2016, 11:10 AM   CNM attestation Post Partum Day #1 I have seen and examined this patient and agree with above documentation in the resident's note.   Cheryl Boyle is a 31 y.o. G1P1001 s/p SVD.  Pt denies problems with ambulating, voiding or po intake. Pain is well controlled.  Plan for birth control is oral progesterone-only contraceptive.  Method of Feeding: breast  PE:  BP (!) 100/51 (BP Location: Left Arm)   Pulse 85   Temp 98.3 F (36.8 C) (Oral)   Resp 16   Ht 5\' 8"  (1.727 m)   Wt 130.6 kg (288 lb)   LMP 08/22/2015 (Exact Date)   SpO2 100%   Breastfeeding? Unknown   BMI 43.79 kg/m  Fundus firm  Plan for discharge: either later on 1/4, or 05/18/16  Cam HaiSHAW, Ralonda Tartt, CNM 12:38 AM

## 2016-05-17 NOTE — Anesthesia Postprocedure Evaluation (Signed)
Anesthesia Post Note  Patient: Cheryl Boyle  Procedure(s) Performed: * No procedures listed *  Patient location during evaluation: Mother Baby Anesthesia Type: Epidural Level of consciousness: awake and alert Pain management: pain level controlled Vital Signs Assessment: post-procedure vital signs reviewed and stable Respiratory status: spontaneous breathing, nonlabored ventilation and respiratory function stable Cardiovascular status: stable Postop Assessment: no headache, no backache and epidural receding Anesthetic complications: no        Last Vitals:  Vitals:   05/17/16 0300 05/17/16 1736  BP: 110/67 (!) 101/59  Pulse: 79 89  Resp: 16 17  Temp: 36.6 C 36.7 C    Last Pain:  Vitals:   05/17/16 1736  TempSrc: Oral  PainSc:    Pain Goal: Patients Stated Pain Goal: 3 (05/17/16 0950)               Kennieth RadFitzgerald, Deicy Rusk E

## 2016-05-17 NOTE — Discharge Summary (Signed)
OB Discharge Summary     Patient Name: Cheryl Boyle DOB: 1985/09/24 MRN: [id number]  Date of admission: 05/14/2016 Delivering MD: Ernestina Penna MICHAEL   Date of discharge: 05/18/2016  Admitting diagnosis: 38wks high blood pressure Intrauterine pregnancy: [redacted]w[redacted]d     Secondary diagnosis:  Active Problems:   Indication for care in labor or delivery  Additional problems: A2DM, GHTN     Discharge diagnosis: Term Pregnancy Delivered, Gestational Hypertension and GDM A2                                                                                                Post partum procedures:none  Augmentation: AROM, Pitocin, Cytotec and Foley Balloon  Complications: None  Hospital course:  Induction of Labor With Vaginal Delivery   31 y.o. yo G1P1001 at [redacted]w[redacted]d was admitted to the hospital 05/14/2016 for induction of labor.  Indication for induction: Gestational hypertension.  Patient had an uncomplicated labor course as follows: Membrane Rupture Time/Date: 7:17 PM ,05/15/2016   Intrapartum Procedures: Episiotomy: None [1]                                         Lacerations:  1st degree [2];Perineal [11]  Patient had delivery of a Viable infant.  Information for the patient's newborn:  Sira, Adsit [[id number]]  Delivery Method: Vaginal, Spontaneous Delivery (Filed from Delivery Summary)   05/16/2016  Details of delivery can be found in separate delivery note.  Patient had a routine postpartum course. Patient is discharged home 05/18/16.   Physical exam  Vitals:   05/16/16 1920 05/17/16 0300 05/17/16 1736 05/18/16 0534  BP: 105/67 110/67 (!) 101/59 (!) 100/51  Pulse: 81 79 89 85  Resp: 16 16 17 16   Temp: 98.3 F (36.8 C) 97.8 F (36.6 C) 98 F (36.7 C) 98.3 F (36.8 C)  TempSrc: Oral Oral Oral Oral  SpO2: 99% 99%  100%  Weight:      Height:       General: alert, cooperative and no distress Lochia: appropriate Uterine Fundus: firm Incision: N/A DVT Evaluation: No evidence  of DVT seen on physical exam. Negative Homan's sign. No significant calf/ankle edema. Labs: Lab Results  Component Value Date   WBC 16.0 (H) 05/16/2016   HGB 9.7 (L) 05/16/2016   HCT 28.5 (L) 05/16/2016   MCV 81.0 05/16/2016   PLT 190 05/16/2016   CMP Latest Ref Rng & Units 05/14/2016  Glucose 65 - 99 mg/dL 782(N)  BUN 6 - 20 mg/dL 14  Creatinine 5.62 - 1.30 mg/dL 8.65  Sodium 784 - 696 mmol/L 136  Potassium 3.5 - 5.1 mmol/L 4.3  Chloride 101 - 111 mmol/L 110  CO2 22 - 32 mmol/L 19(L)  Calcium 8.9 - 10.3 mg/dL 9.0  Total Protein 6.5 - 8.1 g/dL 6.4(L)  Total Bilirubin 0.3 - 1.2 mg/dL <2.9(B)  Alkaline Phos 38 - 126 U/L 158(H)  AST 15 - 41 U/L 14(L)  ALT 14 - 54 U/L 10(L)    Discharge instruction:  per After Visit Summary and "Baby and Me Booklet".  After visit meds:  Allergies as of 05/18/2016      Reactions   Influenza Vaccines Swelling   Swelling and high fever      Medication List    STOP taking these medications   aspirin 81 MG chewable tablet   glyBURIDE 2.5 MG tablet Commonly known as:  DIABETA   prenatal multivitamin Tabs tablet     TAKE these medications   ibuprofen 600 MG tablet Commonly known as:  ADVIL,MOTRIN Take 1 tablet (600 mg total) by mouth every 6 (six) hours.   norethindrone 0.35 MG tablet Commonly known as:  ORTHO MICRONOR Take 1 tablet (0.35 mg total) by mouth daily.       Diet: carb modified diet  Activity: Advance as tolerated. Pelvic rest for 6 weeks.   Outpatient follow WU:JWJXup:baby love nurse to see in a few days Follow up Appt: Future Appointments Date Time Provider Department Center  06/13/2016 1:40 PM Aviva SignsMarie L Williams, CNM [facility]   Follow up Visit:No Follow-up on file.  Postpartum contraception: Progesterone only pills  Newborn Data: Live born female  Birth Weight: 8 lb 0.2 oz (3635 g) APGAR: 8, 9  Baby Feeding: Breast Disposition:home with mother   05/18/2016 Greig RightRESENZO-DISHMAN,Sarayah Bacchi, CNM

## 2016-05-17 NOTE — Discharge Instructions (Signed)

## 2016-05-18 LAB — TYPE AND SCREEN
BLOOD PRODUCT EXPIRATION DATE: 201801162359
BLOOD PRODUCT EXPIRATION DATE: 201801242359
ISSUE DATE / TIME: 201801031407
Unit Type and Rh: 5100
Unit Type and Rh: 5100

## 2016-05-18 MED ORDER — IBUPROFEN 600 MG PO TABS: 600.0000 mg | ORAL_TABLET | Freq: Four times a day (QID) | ORAL | 0 refills | Status: DC

## 2016-05-18 MED ORDER — NORETHINDRONE 0.35 MG PO TABS: 1.0000 | ORAL_TABLET | Freq: Every day | ORAL | 11 refills | Status: DC

## 2016-05-18 NOTE — Lactation Note (Signed)
This note was copied from a baby's chart. Lactation Consultation Note  Patient Name: Cheryl Boyle UZHQU'I Date: 05/18/2016  Mom is primarily formula-feeding, but does desire to breastfeed (she is of the Muslim faith and she would like to breastfeed for 2 years). The last time she pumped was yesterday. I assisted Mom in latching "[name]" to the breast. We first offered the bare breast, but he was unable to latch. We then used a nipple shield (the size 20 had likely been discarded, but there was a size 16 at the bedside). Although frequent swallows were noted, only saliva was noted in the NS. Mom's breasts are not yet filling. A size 20 nipple shield was applied when "[name]" was ready for the next breast. He suckled for a few minutes & then came off. Soon after, he was sucking his hands, so paced bottle-feeding was taught to Mom. Mom was made aware that Alimentum is not halal, so we used regular Similac, instead. Mom noticed that he is appearing jaundiced. Mother reassured that will likely resolve with increased feeding & stooling (last BM was yesterday afternoon).   Mom shown how to assemble & use hand pump that was included in pump kit. Cleaning instructions for nipple shields discussed.   Plan: 1. Using nipple shield, offer breast. 2. After "[name]" has fed from breasts, if he is still hungry, give him formula, using the paced-bottle feeding method. 3. Whenever you give formula, pump your breast(s) for 15 minutes.   Mom has our phone # to call if she has any questions/concerns.   Matthias Hughs [facility] 05/18/2016, 10:38 AM

## 2016-05-20 ENCOUNTER — Inpatient Hospital Stay (HOSPITAL_COMMUNITY)
Admission: AD | Admit: 2016-05-20 | Discharge: 2016-05-20 | Disposition: A | Discharge status: Home / Self Care | Payer: Medicaid Other | Source: Ambulatory Visit | Attending: Obstetrics & Gynecology | Admitting: Obstetrics & Gynecology

## 2016-05-20 ENCOUNTER — Inpatient Hospital Stay (HOSPITAL_COMMUNITY): Payer: Medicaid Other

## 2016-05-20 ENCOUNTER — Encounter (HOSPITAL_COMMUNITY): Payer: Self-pay | Admitting: *Deleted

## 2016-05-20 DIAGNOSIS — E119 Type 2 diabetes mellitus without complications: Secondary | ICD-10-CM | POA: Diagnosis not present

## 2016-05-20 DIAGNOSIS — O9089 Other complications of the puerperium, not elsewhere classified: Secondary | ICD-10-CM | POA: Insufficient documentation

## 2016-05-20 DIAGNOSIS — O2433 Unspecified pre-existing diabetes mellitus in the puerperium: Secondary | ICD-10-CM | POA: Insufficient documentation

## 2016-05-20 DIAGNOSIS — R030 Elevated blood-pressure reading, without diagnosis of hypertension: Secondary | ICD-10-CM | POA: Insufficient documentation

## 2016-05-20 DIAGNOSIS — M7989 Other specified soft tissue disorders: Secondary | ICD-10-CM | POA: Diagnosis present

## 2016-05-20 DIAGNOSIS — O135 Gestational [pregnancy-induced] hypertension without significant proteinuria, complicating the puerperium: Secondary | ICD-10-CM

## 2016-05-20 DIAGNOSIS — R0602 Shortness of breath: Secondary | ICD-10-CM | POA: Diagnosis present

## 2016-05-20 LAB — URINALYSIS, ROUTINE W REFLEX MICROSCOPIC
Bilirubin Urine: NEGATIVE
GLUCOSE, UA: NEGATIVE mg/dL
Ketones, ur: NEGATIVE mg/dL
Nitrite: NEGATIVE
Protein, ur: 100 mg/dL — AB
SPECIFIC GRAVITY, URINE: 1.016 (ref 1.005–1.030)
pH: 6 (ref 5.0–8.0)

## 2016-05-20 LAB — CBC
HEMATOCRIT: 26.6 % — AB (ref 36.0–46.0)
HEMOGLOBIN: 8.9 g/dL — AB (ref 12.0–15.0)
MCH: 27.6 pg (ref 26.0–34.0)
MCHC: 33.5 g/dL (ref 30.0–36.0)
MCV: 82.4 fL (ref 78.0–100.0)
Platelets: 235 10*3/uL (ref 150–400)
RBC: 3.23 MIL/uL — AB (ref 3.87–5.11)
RDW: 14.9 % (ref 11.5–15.5)
WBC: 8.1 10*3/uL (ref 4.0–10.5)

## 2016-05-20 LAB — COMPREHENSIVE METABOLIC PANEL
ALK PHOS: 143 U/L — AB (ref 38–126)
ALT: 25 U/L (ref 14–54)
AST: 25 U/L (ref 15–41)
Albumin: 2.4 g/dL — ABNORMAL LOW (ref 3.5–5.0)
Anion gap: 5 (ref 5–15)
BILIRUBIN TOTAL: 0.2 mg/dL — AB (ref 0.3–1.2)
BUN: 10 mg/dL (ref 6–20)
CALCIUM: 8 mg/dL — AB (ref 8.9–10.3)
CO2: 21 mmol/L — AB (ref 22–32)
CREATININE: 0.67 mg/dL (ref 0.44–1.00)
Chloride: 110 mmol/L (ref 101–111)
GFR calc Af Amer: >60 mL/min (ref >60)
Glucose, Bld: 161 mg/dL — ABNORMAL HIGH (ref 65–99)
Potassium: 4 mmol/L (ref 3.5–5.1)
Sodium: 136 mmol/L (ref 135–145)
TOTAL PROTEIN: 5.7 g/dL — AB (ref 6.5–8.1)

## 2016-05-20 LAB — PROTEIN / CREATININE RATIO, URINE
CREATININE, URINE: 140 mg/dL
Protein Creatinine Ratio: 0.36 mg/mg{Cre} — ABNORMAL HIGH (ref 0.00–0.15)
Total Protein, Urine: 50 mg/dL

## 2016-05-20 IMAGING — CR DG CHEST 2V
2 series · 2 of 2 positions shown · non-contrast
Comparison: None.

CLINICAL DATA: 30-year-old postpartum patient with acute onset of
chest pain, shortness of breath. Current history of hypertension.
Former smoker.

EXAM:
CHEST  2 VIEW

[chest pa]
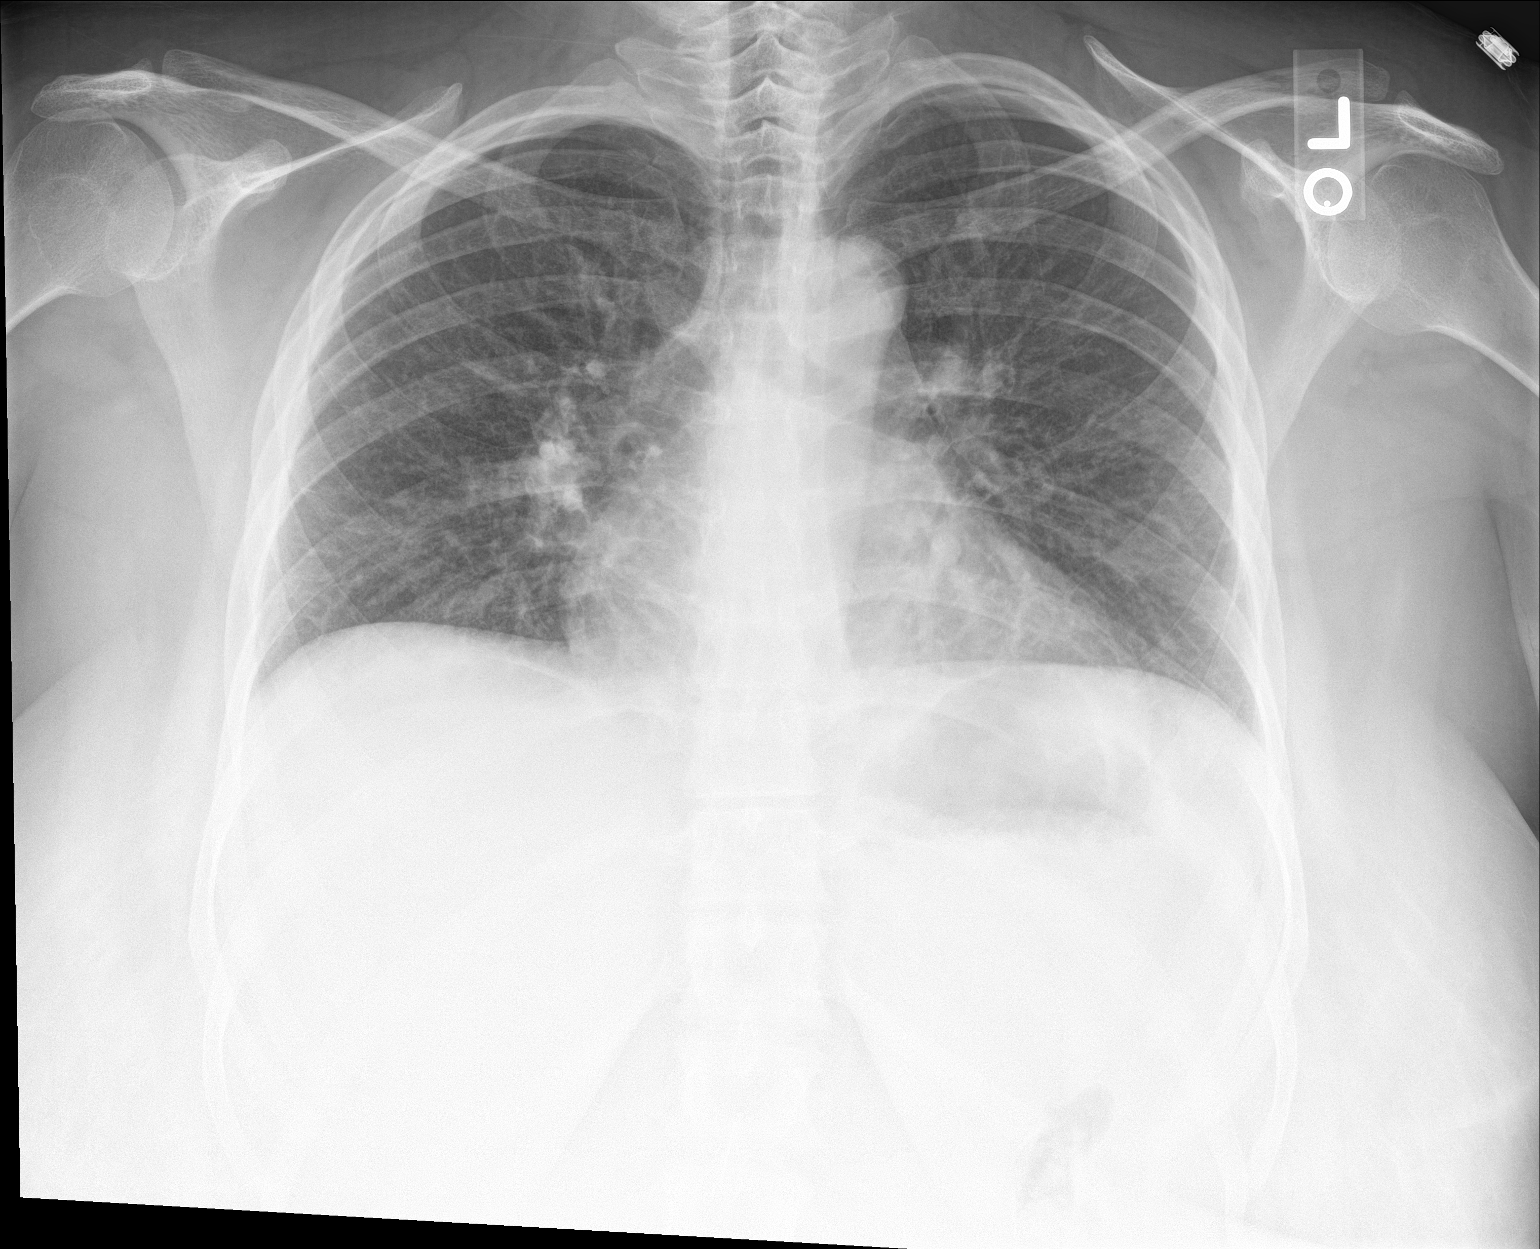

[chest lat]
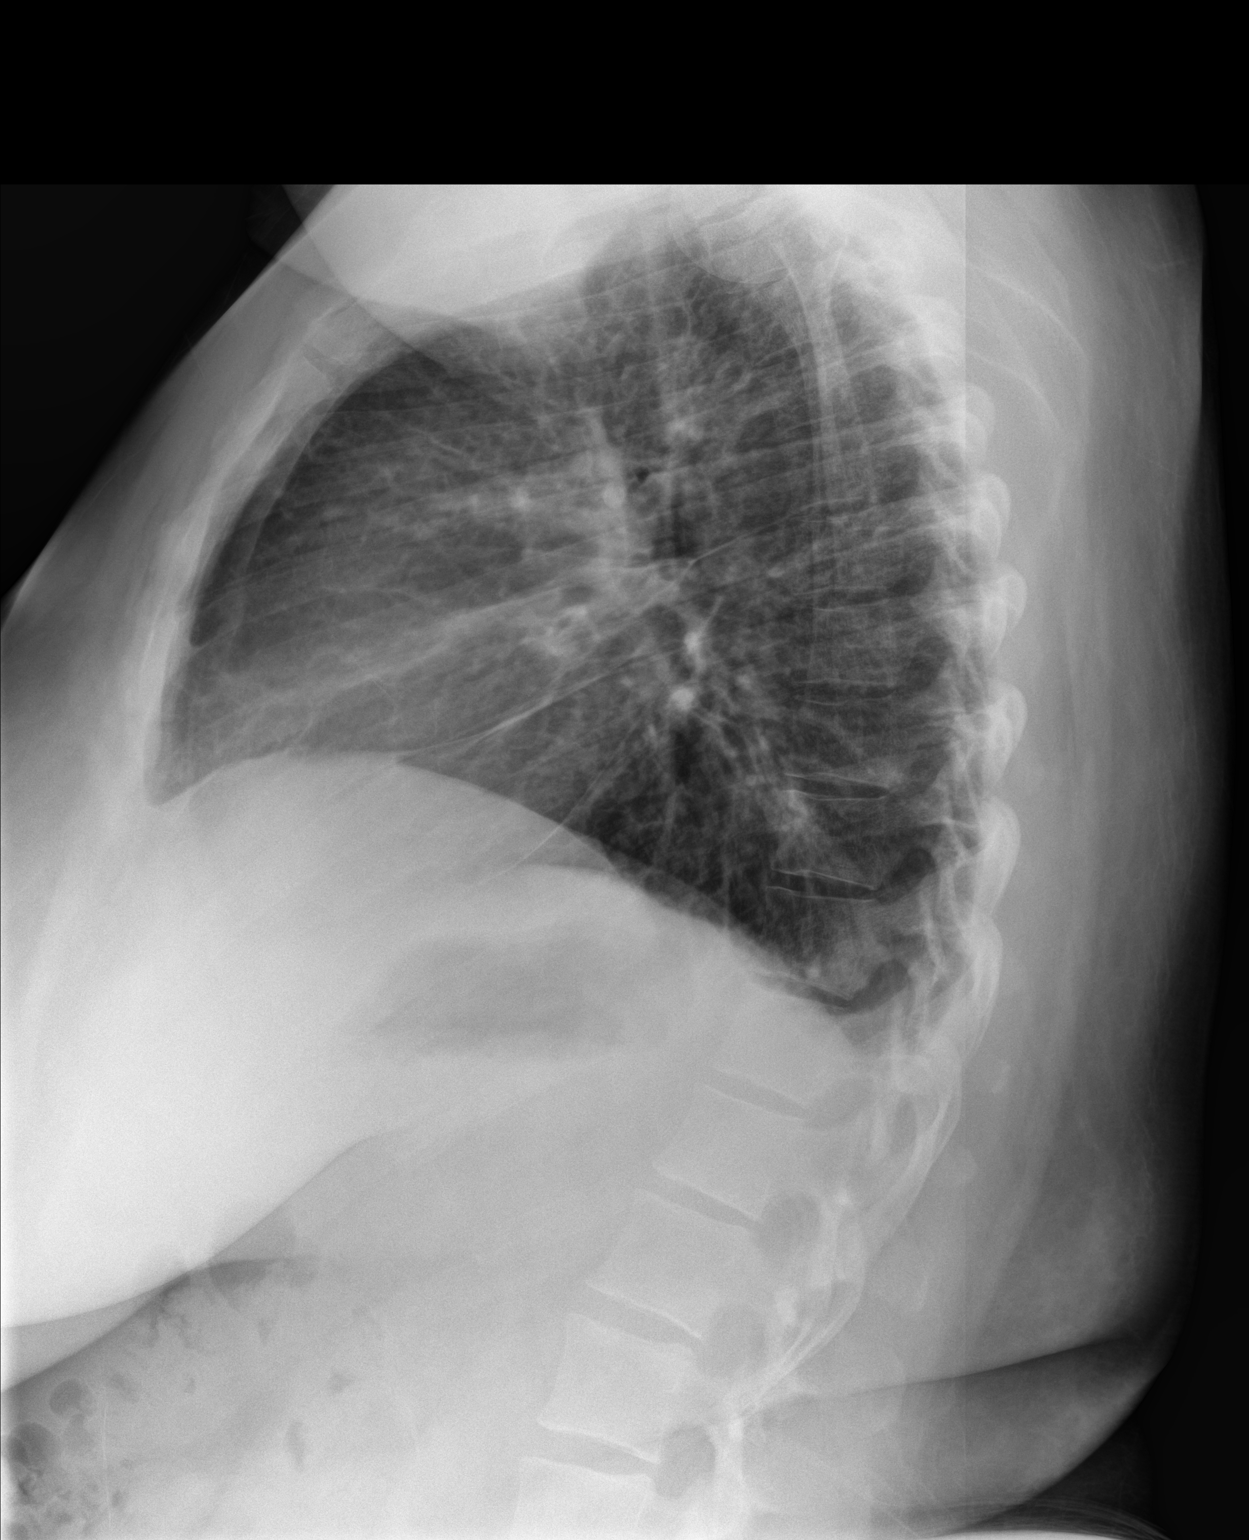

[2 of 2 positions shown; findings below may reference images not displayed]

FINDINGS: Suboptimal inspiration accounts for crowded bronchovascular markings
diffusely and atelectasis in the bases, and accentuates the cardiac
silhouette. Taking this into account, cardiac silhouette normal in
size. Hilar and mediastinal contours unremarkable. Lungs otherwise
clear. No pleural effusions. Visualized bony thorax intact.
IMPRESSION: Suboptimal inspiration accounts for bibasilar atelectasis. No acute
cardiopulmonary disease otherwise.

## 2016-05-20 MED ORDER — ENALAPRIL MALEATE 10 MG PO TABS: 10.0000 mg | ORAL_TABLET | Freq: Every day | ORAL | 1 refills | Status: DC

## 2016-05-20 MED ORDER — ENALAPRIL MALEATE 10 MG PO TABS
10.0000 mg | ORAL_TABLET | Freq: Every day | ORAL | Status: DC
  Administered 2016-05-20: 10 mg via ORAL
  Filled 2016-05-20 (×2): qty 1

## 2016-05-20 NOTE — MAU Provider Note (Signed)
History     CSN: [id number]  Arrival date and time: 05/20/16 1354   First Provider Initiated Contact with Patient 05/20/16 1443      Chief Complaint  Patient presents with  . Shortness of Breath  . Leg Swelling   HPI Cheryl Boyle 30 y.o. Postpartum as of 05-15-16 with gestational hypertension and gestational diabetes.  Was discharged on Friday.  Comes to MAU today with shortness of breath and pain when she takes a deep breath.  Is not putting the baby to breast as she states there is no milk.  Reports she only slept one hour last night.  And her feet continue to be swollen - more swollen since the baby was born than they were in pregnancy.  OB History    Gravida Para Term Preterm AB Living   1 1 1  0 0 1   SAB TAB Ectopic Multiple Live Births   0 0 0 0 1      Past Medical History:  Diagnosis Date  . Diabetes mellitus without complication (HCC)   . Gestational diabetes     Past Surgical History:  Procedure Laterality Date  . TONSILLECTOMY     as a child    Family History  Problem Relation Age of Onset  . Diabetes Mother   . Heart disease Mother     Social History  Substance Use Topics  . Smoking status: Never Smoker  . Smokeless tobacco: Never Used  . Alcohol use No    Allergies:  Allergies  Allergen Reactions  . Influenza Vaccines Swelling    Swelling and high fever    Prescriptions Prior to Admission  Medication Sig Dispense Refill Last Dose  . ibuprofen (ADVIL,MOTRIN) 600 MG tablet Take 1 tablet (600 mg total) by mouth every 6 (six) hours. 30 tablet 0 05/20/2016 at Unknown time  . norethindrone (ORTHO MICRONOR) 0.35 MG tablet Take 1 tablet (0.35 mg total) by mouth daily. 1 Package 11     Review of Systems  Constitutional: Negative for fever.  Eyes: Negative for visual disturbance.  Respiratory: Positive for shortness of breath. Negative for wheezing.   Cardiovascular: Positive for leg swelling.  Gastrointestinal: Negative for abdominal pain.   Genitourinary: Positive for vaginal bleeding.       No breast tenderness  Neurological: Negative for dizziness and headaches.   Physical Exam   Blood pressure 154/72, pulse 81, temperature 97.8 F (36.6 C), temperature source Oral, resp. rate 18, last menstrual period 08/22/2015, unknown if currently breastfeeding.  Physical Exam  Nursing note and vitals reviewed. Constitutional: She is oriented to person, place, and time. She appears well-developed and well-nourished.  HENT:  Head: Normocephalic.  Eyes: EOM are normal.  Neck: Neck supple.  Cardiovascular: Normal rate and regular rhythm.   No murmur heard. Respiratory: Effort normal and breath sounds normal. No respiratory distress. She has no wheezes.  GI: Soft. There is no tenderness.  Musculoskeletal: Normal range of motion.  Lower legs have 2-3+ edema bilaterally  Neurological: She is alert and oriented to person, place, and time.  Skin: Skin is warm and dry.  Psychiatric:  Is tired and frustrated due to lack of sleep.   Serial BP's done - 158/86, 155/87 typical readings - one reading of 176/95 while sleeping but was lying on the BP cuff. O2 sat serial done - always 97-100%  MAU Course  Procedures Results for orders placed or performed during the hospital encounter of 05/20/16 (from the past 24 hour(s))  Urinalysis, Routine  w reflex microscopic     Status: Abnormal   Collection Time: 05/20/16  2:00 PM  Result Value Ref Range   Color, Urine YELLOW YELLOW   APPearance CLOUDY (A) CLEAR   Specific Gravity, Urine 1.016 1.005 - 1.030   pH 6.0 5.0 - 8.0   Glucose, UA NEGATIVE NEGATIVE mg/dL   Hgb urine dipstick LARGE (A) NEGATIVE   Bilirubin Urine NEGATIVE NEGATIVE   Ketones, ur NEGATIVE NEGATIVE mg/dL   Protein, ur 657100 (A) NEGATIVE mg/dL   Nitrite NEGATIVE NEGATIVE   Leukocytes, UA LARGE (A) NEGATIVE   RBC / HPF TOO NUMEROUS TO COUNT 0 - 5 RBC/hpf   WBC, UA TOO NUMEROUS TO COUNT 0 - 5 WBC/hpf   Bacteria, UA FEW (A)  NONE SEEN   Squamous Epithelial / LPF 0-5 (A) NONE SEEN   Mucous PRESENT   Protein / creatinine ratio, urine     Status: Abnormal   Collection Time: 05/20/16  2:00 PM  Result Value Ref Range   Creatinine, Urine 140.00 mg/dL   Total Protein, Urine 50 mg/dL   Protein Creatinine Ratio 0.36 (H) 0.00 - 0.15 mg/mg[Cre]  CBC     Status: Abnormal   Collection Time: 05/20/16  2:40 PM  Result Value Ref Range   WBC 8.1 4.0 - 10.5 K/uL   RBC 3.23 (L) 3.87 - 5.11 MIL/uL   Hemoglobin 8.9 (L) 12.0 - 15.0 g/dL   HCT 84.626.6 (L) 96.236.0 - 95.246.0 %   MCV 82.4 78.0 - 100.0 fL   MCH 27.6 26.0 - 34.0 pg   MCHC 33.5 30.0 - 36.0 g/dL   RDW 84.114.9 32.411.5 - 40.115.5 %   Platelets 235 150 - 400 K/uL  Comprehensive metabolic panel     Status: Abnormal   Collection Time: 05/20/16  2:40 PM  Result Value Ref Range   Sodium 136 135 - 145 mmol/L   Potassium 4.0 3.5 - 5.1 mmol/L   Chloride 110 101 - 111 mmol/L   CO2 21 (L) 22 - 32 mmol/L   Glucose, Bld 161 (H) 65 - 99 mg/dL   BUN 10 6 - 20 mg/dL   Creatinine, Ser 0.270.67 0.44 - 1.00 mg/dL   Calcium 8.0 (L) 8.9 - 10.3 mg/dL   Total Protein 5.7 (L) 6.5 - 8.1 g/dL   Albumin 2.4 (L) 3.5 - 5.0 g/dL   AST 25 15 - 41 U/L   ALT 25 14 - 54 U/L   Alkaline Phosphatase 143 (H) 38 - 126 U/L   Total Bilirubin 0.2 (L) 0.3 - 1.2 mg/dL   GFR calc non Af Amer >60 >60 mL/min   GFR calc Af Amer >60 >60 mL/min   Anion gap 5 5 - 15  PR ratio - not accurate due to postpartal vaginal bleeding,  MDM EKG - done in MAU - normal sinus rhythm, O2 Sat 98-100%. Will get chest Xray to make sure there is not pulmonary edema - chest Xray clear. Given enalapril 10 mg PO in MAU .  Get medication from pharmacy on Monday and take one pill every day for your blood pressure. Discussed plan of care to be seen in clinic at 10 am for BP check Advised to put baby to the breast regularly - client says baby went to breast once yesterday Does not recall agreeing to have a home visit done while in hospital but  pediatrician talked with them about it.  Assessment and Plan  Elevated BP postpartum  Plan Will start on enalapril 10  mg daily Recheck BP in women's clinic on Tuesday at 10 am. Drink at least 8 8-oz glasses of water every day. Take Tylenol 325 mg 2 tablets by mouth every 4 hours if needed for pain. Put baby to the breast every 2-3 hours. Return to the hospital if you have RUQ pain or headaches or any visual changes.  Terri L Burleson 05/20/2016, 2:47 PM

## 2016-05-20 NOTE — Discharge Instructions (Signed)
Get your medicine from your pharmacy and take one tablet daily. No Sex until 6 weeks after the baby comes.   The blood pressure medicine you are taking should not be taken in pregnancy so if you become pregnant, you will need to change the BP medication you are taking. Recheck BP in women's clinic on Tuesday at 10 am. Drink at least 8 8-oz glasses of water every day. Take Tylenol 325 mg 2 tablets by mouth every 4 hours if needed for pain. Put baby to the breast every 2-3 hours. Return to the hospital if you have RUQ pain or headaches or any visual changes.

## 2016-05-20 NOTE — MAU Note (Signed)
Pt presents to MAU stating that it is hard to catch her breath and swelling in her feet. Pt delivered vaginally January the 3rd. Was given medications while in labor for her blood pressure being elevated

## 2016-06-13 ENCOUNTER — Ambulatory Visit: Payer: Self-pay | Admitting: Advanced Practice Midwife

## 2016-11-16 ENCOUNTER — Encounter (HOSPITAL_COMMUNITY): Payer: Self-pay

## 2016-11-16 ENCOUNTER — Emergency Department (HOSPITAL_COMMUNITY)
Admission: EM | Admit: 2016-11-16 | Discharge: 2016-11-16 | Disposition: A | Discharge status: Home / Self Care | Payer: Medicaid Other | Attending: Emergency Medicine | Admitting: Emergency Medicine

## 2016-11-16 ENCOUNTER — Emergency Department (HOSPITAL_COMMUNITY): Payer: Medicaid Other

## 2016-11-16 DIAGNOSIS — R1032 Left lower quadrant pain: Secondary | ICD-10-CM | POA: Diagnosis not present

## 2016-11-16 DIAGNOSIS — E119 Type 2 diabetes mellitus without complications: Secondary | ICD-10-CM | POA: Insufficient documentation

## 2016-11-16 DIAGNOSIS — F17210 Nicotine dependence, cigarettes, uncomplicated: Secondary | ICD-10-CM | POA: Insufficient documentation

## 2016-11-16 LAB — URINALYSIS, ROUTINE W REFLEX MICROSCOPIC
BILIRUBIN URINE: NEGATIVE
Bacteria, UA: NONE SEEN
GLUCOSE, UA: NEGATIVE mg/dL
Hgb urine dipstick: NEGATIVE
Ketones, ur: NEGATIVE mg/dL
Nitrite: NEGATIVE
PH: 6 (ref 5.0–8.0)
Protein, ur: NEGATIVE mg/dL
SPECIFIC GRAVITY, URINE: 1.015 (ref 1.005–1.030)

## 2016-11-16 LAB — COMPREHENSIVE METABOLIC PANEL
ALBUMIN: 4.1 g/dL (ref 3.5–5.0)
ALK PHOS: 116 U/L (ref 38–126)
ALT: 31 U/L (ref 14–54)
ANION GAP: 6 (ref 5–15)
AST: 23 U/L (ref 15–41)
BILIRUBIN TOTAL: 0.1 mg/dL — AB (ref 0.3–1.2)
BUN: 11 mg/dL (ref 6–20)
CALCIUM: 9.1 mg/dL (ref 8.9–10.3)
CO2: 26 mmol/L (ref 22–32)
Chloride: 105 mmol/L (ref 101–111)
Creatinine, Ser: 0.6 mg/dL (ref 0.44–1.00)
GFR calc Af Amer: >60 mL/min (ref >60)
GLUCOSE: 112 mg/dL — AB (ref 65–99)
Potassium: 3.5 mmol/L (ref 3.5–5.1)
Sodium: 137 mmol/L (ref 135–145)
TOTAL PROTEIN: 8 g/dL (ref 6.5–8.1)

## 2016-11-16 LAB — WET PREP, GENITAL
Clue Cells Wet Prep HPF POC: NONE SEEN
SPERM: NONE SEEN
Trich, Wet Prep: NONE SEEN
Yeast Wet Prep HPF POC: NONE SEEN

## 2016-11-16 LAB — CBC WITH DIFFERENTIAL/PLATELET
Basophils Absolute: 0 10*3/uL (ref 0.0–0.1)
Basophils Relative: 0 %
Eosinophils Absolute: 0.1 10*3/uL (ref 0.0–0.7)
Eosinophils Relative: 1 %
HCT: 39 % (ref 36.0–46.0)
HEMOGLOBIN: 12.2 g/dL (ref 12.0–15.0)
LYMPHS ABS: 3.1 10*3/uL (ref 0.7–4.0)
Lymphocytes Relative: 33 %
MCH: 24.2 pg — AB (ref 26.0–34.0)
MCHC: 31.3 g/dL (ref 30.0–36.0)
MCV: 77.4 fL — ABNORMAL LOW (ref 78.0–100.0)
MONOS PCT: 5 %
Monocytes Absolute: 0.5 10*3/uL (ref 0.1–1.0)
NEUTROS ABS: 5.7 10*3/uL (ref 1.7–7.7)
NEUTROS PCT: 61 %
Platelets: 326 10*3/uL (ref 150–400)
RBC: 5.04 MIL/uL (ref 3.87–5.11)
RDW: 15.1 % (ref 11.5–15.5)
WBC: 9.4 10*3/uL (ref 4.0–10.5)

## 2016-11-16 LAB — POC URINE PREG, ED: PREG TEST UR: NEGATIVE

## 2016-11-16 LAB — LIPASE, BLOOD: Lipase: 15 U/L (ref 11–51)

## 2016-11-16 MED ORDER — IOPAMIDOL (ISOVUE-300) INJECTION 61%
100.0000 mL | Freq: Once | INTRAVENOUS | Status: AC | PRN
  Administered 2016-11-16: 100 mL via INTRAVENOUS

## 2016-11-16 MED ORDER — IOPAMIDOL (ISOVUE-300) INJECTION 61%
INTRAVENOUS | Status: AC
  Administered 2016-11-16: 100 mL via INTRAVENOUS
  Filled 2016-11-16: qty 100

## 2016-11-16 NOTE — ED Provider Notes (Signed)
[facility]-EMERGENCY DEPT Provider Note   CSN: [id number] Arrival date & time: 11/16/16  1219     History   Chief Complaint Chief Complaint  Patient presents with  . Abdominal Pain    HPI Cheryl Boyle is a 31 y.o. female.  HPI   Cheryl Boyle is a 31 y.o. female, with a history of DM, presenting to the ED with Abdominal pain for the last 3 days. Pain is in the left lower quadrant, cramping, intermittent, radiating across the abdomen, currently rates it 6 out of 10. Improvement with flatulence and bowel movements. Last bowel movement this morning and normal. Has not had this issue before. Has not tried any medications for this issue.  Denies N/V/C/D, fever/chills, vaginal discharge, urinary complaints, hematochezia/melena, or any other complaints.  LMP 6/26. G1P1 with SVD in January 2018. States she is not sexually active. Last by mouth intake was this morning.   Past Medical History:  Diagnosis Date  . Diabetes mellitus without complication (HCC)   . Gestational diabetes     Patient Active Problem List   Diagnosis Date Noted  . Indication for care in labor or delivery 05/14/2016  . Gonorrhea in pregnancy, antepartum, third trimester 11/25/2015  . Diabetes mellitus during pregnancy, antepartum 11/25/2015  . Obesity in pregnancy 11/23/2015  . Pregnancy, supervision, high-risk, third trimester 11/23/2015    Past Surgical History:  Procedure Laterality Date  . TONSILLECTOMY     as a child    OB History    Gravida Para Term Preterm AB Living   1 1 1  0 0 1   SAB TAB Ectopic Multiple Live Births   0 0 0 0 1       Home Medications    Prior to Admission medications   Not on File    Family History Family History  Problem Relation Age of Onset  . Diabetes Mother   . Heart disease Mother     Social History Social History  Substance Use Topics  . Smoking status: Current Some Day Smoker  . Smokeless tobacco: Never Used     Comment: once in a while 1 cigarette    . Alcohol use No     Allergies   Influenza vaccines   Review of Systems Review of Systems  Constitutional: Negative for chills, diaphoresis and fever.  Respiratory: Negative for shortness of breath.   Cardiovascular: Negative for chest pain.  Gastrointestinal: Positive for abdominal pain. Negative for constipation, diarrhea, nausea and vomiting.  Genitourinary: Negative for dysuria, flank pain, frequency, vaginal bleeding and vaginal discharge.  All other systems reviewed and are negative.    Physical Exam Updated Vital Signs BP (!) 141/106 (BP Location: Right Arm)   Pulse 86   Temp 98.3 F (36.8 C) (Oral)   Resp 15   Ht 5\' 4"  (1.626 m)   LMP 11/06/2016   SpO2 100%   Physical Exam  Constitutional: She appears well-developed and well-nourished. No distress.  HENT:  Head: Normocephalic and atraumatic.  Eyes: Conjunctivae are normal.  Neck: Neck supple.  Cardiovascular: Normal rate, regular rhythm, normal heart sounds and intact distal pulses.   Pulmonary/Chest: Effort normal and breath sounds normal. No respiratory distress.  Abdominal: Soft. Bowel sounds are normal. There is tenderness in the right lower quadrant, periumbilical area, suprapubic area and left lower quadrant. There is no rigidity and no guarding.  Genitourinary:  Genitourinary Comments: External genitalia normal Vagina without discharge Cervix  normal negative for cervical motion tenderness Adnexa palpated, no masses, negative  for tenderness noted Bladder palpated negative for tenderness Uterus palpated no masses, negative for tenderness  Otherwise normal female genitalia. 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  Musculoskeletal: She exhibits no edema.  Lymphadenopathy:    She has no cervical adenopathy.  Neurological: She is alert.  Skin: Skin is warm and dry. She is not diaphoretic.  Psychiatric: She has a normal mood and affect. Her behavior is normal.  Nursing note and vitals  reviewed.    ED Treatments / Results  Labs (all labs ordered are listed, but only abnormal results are displayed) Labs Reviewed  WET PREP, GENITAL - Abnormal; Notable for the following:       Result Value   WBC, Wet Prep HPF POC FEW (*)    All other components within normal limits  COMPREHENSIVE METABOLIC PANEL - Abnormal; Notable for the following:    Glucose, Bld 112 (*)    Total Bilirubin 0.1 (*)    All other components within normal limits  URINALYSIS, ROUTINE W REFLEX MICROSCOPIC - Abnormal; Notable for the following:    Leukocytes, UA TRACE (*)    Squamous Epithelial / LPF 0-5 (*)    All other components within normal limits  CBC WITH DIFFERENTIAL/PLATELET - Abnormal; Notable for the following:    MCV 77.4 (*)    MCH 24.2 (*)    All other components within normal limits  LIPASE, BLOOD  RPR  HIV ANTIBODY (ROUTINE TESTING)  I-STAT BETA HCG BLOOD, ED ([facility], [facility], [facility] ONLY)  POC URINE PREG, ED  GC/CHLAMYDIA PROBE AMP ([place]) NOT AT [facility]    EKG  EKG Interpretation None       Radiology Ct Abdomen Pelvis W Contrast  Result Date: 11/16/2016 CLINICAL DATA:  Abdominal pain for 3 days.  Left lower quadrant. EXAM: CT ABDOMEN AND PELVIS WITH CONTRAST TECHNIQUE: Multidetector CT imaging of the abdomen and pelvis was performed using the standard protocol following bolus administration of intravenous contrast. CONTRAST:  ISOVUE-300 IOPAMIDOL (ISOVUE-300) INJECTION 61% COMPARISON:  None. FINDINGS: Lower chest:  Unremarkable. Hepatobiliary: The liver shows diffusely decreased attenuation suggesting steatosis. There is no evidence for gallstones, gallbladder wall thickening, or pericholecystic fluid. No intrahepatic or extrahepatic biliary dilation. Pancreas: No focal mass lesion. No dilatation of the main duct. No intraparenchymal cyst. No peripancreatic edema. Spleen: No splenomegaly. No focal mass lesion. Adrenals/Urinary Tract: No adrenal nodule or mass. Kidneys are  unremarkable. No evidence for hydroureter. The urinary bladder appears normal for the degree of distention. Stomach/Bowel: Stomach is nondistended. No gastric wall thickening. No evidence of outlet obstruction. Duodenum is normally positioned as is the ligament of Treitz. No small bowel wall thickening. No small bowel dilatation. The terminal ileum is normal. The appendix is normal. No gross colonic mass. No colonic wall thickening. No substantial diverticular change. Vascular/Lymphatic: No abdominal aortic aneurysm. No abdominal aortic atherosclerotic calcification. There is no gastrohepatic or hepatoduodenal ligament lymphadenopathy. No intraperitoneal or retroperitoneal lymphadenopathy. No pelvic sidewall lymphadenopathy. Scattered small lymph nodes are seen in the common femoral regions bilaterally. Reproductive: The uterus has normal CT imaging appearance. There is no adnexal mass. Other: No intraperitoneal free fluid. Musculoskeletal: Bone windows reveal no worrisome lytic or sclerotic osseous lesions. IMPRESSION: 1. No acute findings in the abdomen or pelvis. No findings to explain the patient's history of abdominal pain. 2. Hepatic steatosis. Electronically Signed   By: Kennith Center M.D.   On: 11/16/2016 17:42    Procedures Procedures (including critical care time)  Medications Ordered in  ED Medications  iopamidol (ISOVUE-300) 61 % injection 100 mL (100 mLs Intravenous Contrast Given 11/16/16 1719)     Initial Impression / Assessment and Plan / ED Course  I have reviewed the triage vital signs and the nursing notes.  Pertinent labs & imaging results that were available during my care of the patient were reviewed by me and considered in my medical decision making (see chart for details).  Clinical Course as of Nov 17 2326  Fri Nov 16, 2016  1400 Current pain is 5/10 and squeezing  [SJ]    Clinical Course User Index [SJ] Anselm PancoastJoy, Adamari Frede C, PA-C    Patient presents with intermittent abdominal  pain. Lab results encouraging. CT suggested due to exam findings. Shared decision making was used regarding CT versus watchful waiting over the next 24 hours. Patient opted for the CT scan. Very low suspicion for GU origin of pain. CT without acute abnormality. Patient's pain resolved spontaneously following a bowel movement and did not return. PCP versus GI follow-up as needed. The patient was given instructions for home care as well as return precautions. Patient voices understanding of these instructions, accepts the plan, and is comfortable with discharge.   Vitals:   11/16/16 1240 11/16/16 1615 11/16/16 1829  BP: (!) 141/106 (!) 153/96 (!) 137/97  Pulse: 86 77 87  Resp: 15 16 16   Temp: 98.3 F (36.8 C)    TempSrc: Oral    SpO2: 100% 100% 100%  Height: 5\' 4"  (1.626 m)       Final Clinical Impressions(s) / ED Diagnoses   Final diagnoses:  Left lower quadrant pain    New Prescriptions There are no discharge medications for this patient.    Anselm PancoastJoy, Tabitha Riggins C, PA-C 11/16/16 2331    Doug SouJacubowitz, Sam, MD 11/21/16 [phone]

## 2016-11-16 NOTE — ED Triage Notes (Signed)
Patient c/o of abdominal pain x3 days, feeling worse today. Described as squeezing in LLQ. Denies nausea and vomiting.

## 2016-11-16 NOTE — Discharge Instructions (Signed)
There were no acute or significant abnormalities noted on the imaging or lab work. Be sure to keep well hydrated. Increase fiber intake. Follow up with a primary care provider or gastroenterology specialist should this issue persist. Return to the ED should symptoms worsen.

## 2016-11-17 LAB — HIV ANTIBODY (ROUTINE TESTING W REFLEX): HIV SCREEN 4TH GENERATION: NONREACTIVE

## 2016-11-17 LAB — RPR: RPR Ser Ql: NONREACTIVE

## 2016-11-19 LAB — GC/CHLAMYDIA PROBE AMP (~~LOC~~) NOT AT ARMC
CHLAMYDIA, DNA PROBE: NEGATIVE
Neisseria Gonorrhea: NEGATIVE

## 2017-08-27 ENCOUNTER — Encounter: Payer: Self-pay | Admitting: *Deleted

## 2018-03-10 ENCOUNTER — Encounter: Payer: Self-pay | Admitting: *Deleted

## 2018-03-17 ENCOUNTER — Ambulatory Visit: Payer: Medicaid Other | Admitting: Dietician

## 2018-05-13 ENCOUNTER — Emergency Department (HOSPITAL_COMMUNITY): Payer: Medicaid Other

## 2018-05-13 ENCOUNTER — Other Ambulatory Visit: Payer: Self-pay

## 2018-05-13 ENCOUNTER — Emergency Department (HOSPITAL_COMMUNITY)
Admission: EM | Admit: 2018-05-13 | Discharge: 2018-05-13 | Disposition: A | Discharge status: Home / Self Care | Payer: Medicaid Other | Attending: Emergency Medicine | Admitting: Emergency Medicine

## 2018-05-13 DIAGNOSIS — R1012 Left upper quadrant pain: Secondary | ICD-10-CM | POA: Diagnosis not present

## 2018-05-13 DIAGNOSIS — Z5321 Procedure and treatment not carried out due to patient leaving prior to being seen by health care provider: Secondary | ICD-10-CM | POA: Diagnosis not present

## 2018-05-13 DIAGNOSIS — R0602 Shortness of breath: Secondary | ICD-10-CM | POA: Insufficient documentation

## 2018-05-13 LAB — COMPREHENSIVE METABOLIC PANEL
ALBUMIN: 3.6 g/dL (ref 3.5–5.0)
ALK PHOS: 85 U/L (ref 38–126)
ALT: 30 U/L (ref 0–44)
AST: 26 U/L (ref 15–41)
Anion gap: 10 (ref 5–15)
BILIRUBIN TOTAL: 0.4 mg/dL (ref 0.3–1.2)
BUN: 10 mg/dL (ref 6–20)
CALCIUM: 9.1 mg/dL (ref 8.9–10.3)
CO2: 23 mmol/L (ref 22–32)
Chloride: 103 mmol/L (ref 98–111)
Creatinine, Ser: 0.57 mg/dL (ref 0.44–1.00)
GFR calc Af Amer: >60 mL/min (ref >60)
GFR calc non Af Amer: >60 mL/min (ref >60)
GLUCOSE: 136 mg/dL — AB (ref 70–99)
Potassium: 3.4 mmol/L — ABNORMAL LOW (ref 3.5–5.1)
Sodium: 136 mmol/L (ref 135–145)
TOTAL PROTEIN: 6.8 g/dL (ref 6.5–8.1)

## 2018-05-13 LAB — URINALYSIS, ROUTINE W REFLEX MICROSCOPIC
Bacteria, UA: NONE SEEN
Bilirubin Urine: NEGATIVE
Glucose, UA: NEGATIVE mg/dL
HGB URINE DIPSTICK: NEGATIVE
Ketones, ur: 5 mg/dL — AB
NITRITE: NEGATIVE
Protein, ur: NEGATIVE mg/dL
Specific Gravity, Urine: 1.023 (ref 1.005–1.030)
pH: 5 (ref 5.0–8.0)

## 2018-05-13 LAB — CBC
HCT: 38.8 % (ref 36.0–46.0)
Hemoglobin: 11.9 g/dL — ABNORMAL LOW (ref 12.0–15.0)
MCH: 25.3 pg — ABNORMAL LOW (ref 26.0–34.0)
MCHC: 30.7 g/dL (ref 30.0–36.0)
MCV: 82.6 fL (ref 80.0–100.0)
Platelets: 292 10*3/uL (ref 150–400)
RBC: 4.7 MIL/uL (ref 3.87–5.11)
RDW: 14.6 % (ref 11.5–15.5)
WBC: 12.6 10*3/uL — ABNORMAL HIGH (ref 4.0–10.5)
nRBC: 0 % (ref 0.0–0.2)

## 2018-05-13 LAB — I-STAT TROPONIN, ED: Troponin i, poc: 0 ng/mL (ref 0.00–0.08)

## 2018-05-13 LAB — I-STAT BETA HCG BLOOD, ED (MC, WL, AP ONLY): I-stat hCG, quantitative: <5 m[IU]/mL (ref <5)

## 2018-05-13 LAB — LIPASE, BLOOD: Lipase: 24 U/L (ref 11–51)

## 2018-05-13 NOTE — ED Notes (Signed)
Called to RM Pt. No Answer. Also do not see Pt

## 2018-05-13 NOTE — ED Triage Notes (Signed)
Pt states that today while she was cooking she began having shortness of breath, and states that tonight while she was laying down she  Began having chest tightness and pain/numbness in her left arm. Pt also reporting pain in her ULQ of abdomen.

## 2018-09-01 ENCOUNTER — Encounter: Payer: Self-pay | Admitting: *Deleted

## 2019-04-29 ENCOUNTER — Other Ambulatory Visit: Payer: Self-pay

## 2019-04-29 ENCOUNTER — Emergency Department (HOSPITAL_COMMUNITY)
Admission: EM | Admit: 2019-04-29 | Discharge: 2019-04-29 | Disposition: A | Discharge status: Home / Self Care | Payer: Medicaid Other | Attending: Emergency Medicine | Admitting: Emergency Medicine

## 2019-04-29 ENCOUNTER — Encounter (HOSPITAL_COMMUNITY): Payer: Self-pay | Admitting: Emergency Medicine

## 2019-04-29 DIAGNOSIS — F17210 Nicotine dependence, cigarettes, uncomplicated: Secondary | ICD-10-CM | POA: Insufficient documentation

## 2019-04-29 DIAGNOSIS — N63 Unspecified lump in unspecified breast: Secondary | ICD-10-CM

## 2019-04-29 DIAGNOSIS — E119 Type 2 diabetes mellitus without complications: Secondary | ICD-10-CM | POA: Diagnosis not present

## 2019-04-29 DIAGNOSIS — N6310 Unspecified lump in the right breast, unspecified quadrant: Secondary | ICD-10-CM | POA: Diagnosis not present

## 2019-04-29 NOTE — ED Notes (Signed)
Pt in no distress. Discharge Instructions given to pt who verbalized understanding to for follow-up.

## 2019-04-29 NOTE — ED Triage Notes (Signed)
Pt reports for 2 months breast will have bumps and pains in axilla area on both sides. Reports last month was painful but not anymore.

## 2019-04-29 NOTE — ED Provider Notes (Signed)
[facility] Provider Note   CSN: [id number] Arrival date & time: 04/29/19  1154     History Chief Complaint  Patient presents with  . Breast Problem    Airis Erby is a 33 y.o. female.  The history is provided by the patient. No language interpreter was used.       33 year old female presenting complaining of breast discomfort.  Patient report for the past month she has noticed some irregularity in shape involving her right breast. She notice lump in R breast as well as retraction of her nipple, which lasted for a few days and resolved.  She also notice lump to both armpits that came and went.  Sts it was not tender, no redness, no nipple discharge.  Denies fever, chills, cp, sob.  No injury.  She is currently on her menstruation.  She does not have a PCP, is not on birth control pill, and sts she google about her breast finding and was concern for potential cancer.  She does not have significant family hx of breast cancer. No smoking hx.   Past Medical History:  Diagnosis Date  . Diabetes mellitus without complication (HCC)   . Gestational diabetes     Patient Active Problem List   Diagnosis Date Noted  . Indication for care in labor or delivery 05/14/2016  . Gonorrhea in pregnancy, antepartum, third trimester 11/25/2015  . Diabetes mellitus during pregnancy, antepartum 11/25/2015  . Obesity in pregnancy 11/23/2015  . Pregnancy, supervision, high-risk, third trimester 11/23/2015    Past Surgical History:  Procedure Laterality Date  . TONSILLECTOMY     as a child     OB History    Gravida  1   Para  1   Term  1   Preterm  0   AB  0   Living  1     SAB  0   TAB  0   Ectopic  0   Multiple  0   Live Births  1           Family History  Problem Relation Age of Onset  . Diabetes Mother   . Heart disease Mother     Social History   Tobacco Use  . Smoking status: Current Some Day Smoker  . Smokeless  tobacco: Never Used  . Tobacco comment: once in a while 1 cigarette  Substance Use Topics  . Alcohol use: No  . Drug use: No    Home Medications Prior to Admission medications   Not on File    Allergies    Influenza vaccines  Review of Systems   Review of Systems  All other systems reviewed and are negative.   Physical Exam Updated Vital Signs BP (!) 157/104 (BP Location: Left Arm)   Pulse 78   Temp 98.1 F (36.7 C) (Oral)   Resp 16   LMP 04/25/2019   SpO2 95%   Physical Exam Vitals and nursing note reviewed. Exam conducted with a chaperone present.  Constitutional:      General: She is not in acute distress.    Appearance: She is well-developed.  HENT:     Head: Atraumatic.  Eyes:     Conjunctiva/sclera: Conjunctivae normal.  Cardiovascular:     Rate and Rhythm: Normal rate and regular rhythm.  Pulmonary:     Effort: Pulmonary effort is normal.     Breath sounds: Normal breath sounds.  Chest:     Chest wall: No mass or  tenderness.     Breasts: Breasts are symmetrical.        Right: Normal. No swelling, bleeding, inverted nipple, mass, nipple discharge, skin change or tenderness.        Left: Normal. No swelling, bleeding, inverted nipple, mass, nipple discharge, skin change or tenderness.  Musculoskeletal:     Cervical back: Neck supple.  Lymphadenopathy:     Upper Body:     Right upper body: No supraclavicular or axillary adenopathy.     Left upper body: No supraclavicular or axillary adenopathy.  Skin:    Findings: No rash.  Neurological:     Mental Status: She is alert.     ED Results / Procedures / Treatments   Labs (all labs ordered are listed, but only abnormal results are displayed) Labs Reviewed - No data to display  EKG None  Radiology No results found.  Procedures Procedures (including critical care time)  Medications Ordered in ED Medications - No data to display  ED Course  I have reviewed the triage vital signs and the  nursing notes.  Pertinent labs & imaging results that were available during my care of the patient were reviewed by me and considered in my medical decision making (see chart for details).    MDM Rules/Calculators/A&P                      BP (!) 157/104 (BP Location: Left Arm)   Pulse 78   Temp 98.1 F (36.7 C) (Oral)   Resp 16   LMP 04/25/2019   SpO2 95%   Final Clinical Impression(s) / ED Diagnoses Final diagnoses:  Breast swelling    Rx / DC Orders ED Discharge Orders         Ordered    US BREAST COMPLETE UNI RIGHT INC AXILLA     04/29/19 1421    US BREAST COMPLETE UNI LEFT INC AXILLA     04/29/19 1421         2:18 PM Patient here with concerns of lumps on her breast as well as changing her nipple shaped that was waxing waning within the past month.  She does not exhibit any kind of pain and denies having any active pain.  On exam no concerning lesion noted.  No peau d'orange no nipple inversion and no nipple discharge.  She is currently on her menstruation.  Her finding may suggest fibroadenoma or fibrocystic lesion however will refer patient to the breast center for breast ultrasound to evaluate for any potential malignancy.  No evidence of infection on exam.   Domenic Moras, PA-C 04/29/19 1422    Tegeler, Gwenyth Allegra, MD 04/29/19 [phone]

## 2019-04-29 NOTE — Discharge Instructions (Signed)
Please call and follow up with the [facility] for ultrasound of your breasts for further evaluation of your symptom.  Return if you have any concerns.

## 2019-04-30 ENCOUNTER — Other Ambulatory Visit (HOSPITAL_COMMUNITY): Payer: Self-pay | Admitting: Emergency Medicine

## 2020-08-28 ENCOUNTER — Emergency Department (HOSPITAL_COMMUNITY)
Admission: EM | Admit: 2020-08-28 | Discharge: 2020-08-28 | Disposition: A | Discharge status: Home / Self Care | Payer: Medicaid Other | Attending: Emergency Medicine | Admitting: Emergency Medicine

## 2020-08-28 ENCOUNTER — Encounter (HOSPITAL_COMMUNITY): Payer: Self-pay

## 2020-08-28 ENCOUNTER — Emergency Department (HOSPITAL_COMMUNITY): Payer: Medicaid Other

## 2020-08-28 ENCOUNTER — Other Ambulatory Visit: Payer: Self-pay

## 2020-08-28 DIAGNOSIS — Y92007 Garden or yard of unspecified non-institutional (private) residence as the place of occurrence of the external cause: Secondary | ICD-10-CM | POA: Insufficient documentation

## 2020-08-28 DIAGNOSIS — S99911A Unspecified injury of right ankle, initial encounter: Secondary | ICD-10-CM | POA: Diagnosis present

## 2020-08-28 DIAGNOSIS — W010XXA Fall on same level from slipping, tripping and stumbling without subsequent striking against object, initial encounter: Secondary | ICD-10-CM | POA: Diagnosis not present

## 2020-08-28 DIAGNOSIS — S93401A Sprain of unspecified ligament of right ankle, initial encounter: Secondary | ICD-10-CM | POA: Diagnosis not present

## 2020-08-28 MED ORDER — ACETAMINOPHEN 325 MG PO TABS
650.0000 mg | ORAL_TABLET | Freq: Once | ORAL | Status: AC
  Administered 2020-08-28: 650 mg via ORAL
  Filled 2020-08-28: qty 2

## 2020-08-28 NOTE — ED Notes (Signed)
Patient verbalizes understanding of discharge instructions. Opportunity for questioning and answers were provided. Armband removed by staff, pt discharged from ED.  

## 2020-08-28 NOTE — Discharge Instructions (Addendum)
Call your primary care doctor or specialist as discussed in the next 2-3 days.   Return immediately back to the ER if:  Your symptoms worsen within the next 12-24 hours. You develop new symptoms such as new fevers, persistent vomiting, new pain, shortness of breath, or new weakness or numbness, or if you have any other concerns.  

## 2020-08-28 NOTE — Progress Notes (Signed)
Orthopedic Tech Progress Note Patient Details:  Cheryl Boyle [birth year] [id number]  Ortho Devices Type of Ortho Device: Ankle Air splint,Crutches Ortho Device/Splint Location: Right Lower Extremity Ortho Device/Splint Interventions: Ordered,Application,Adjustment   Post Interventions Patient Tolerated: Well,Unable to use device properly (Unable to ambulate properly on crutches. I informed both MD and nurse of difficulty to use crtutches and the possiblity of opting for another mobility device.) Instructions Provided: Adjustment of device,Care of device,Poper ambulation with device   Marc Leichter [name] 08/28/2020, 4:12 PM

## 2020-08-28 NOTE — ED Notes (Signed)
Patient transported to X-ray at this time 

## 2020-08-28 NOTE — ED Provider Notes (Addendum)
[facility] EMERGENCY DEPARTMENT Provider Note   CSN: [id number] Arrival date & time: 08/28/20  1410     History Chief Complaint  Patient presents with  . Fall    Trip and fall in her back yard, hit grass, no LOC, did not hit head, possible right ankle fx    Cheryl Boyle is a 35 y.o. female.  Patient complaining of right ankle pain.  She states that she was out in the grass when she tripped and fell and twisted her right ankle.  Complaining of right ankle pain.  Describes it as sharp and persistent more on the lateral side.  Denies loss of consciousness back pain knee pain or any other extremity pain.  Patient states that several years ago in [place] she had fracture of the right ankle but never had it repaired.        Past Medical History:  Diagnosis Date  . Diabetes mellitus without complication (HCC)   . Gestational diabetes     Patient Active Problem List   Diagnosis Date Noted  . Indication for care in labor or delivery 05/14/2016  . Gonorrhea in pregnancy, antepartum, third trimester 11/25/2015  . Diabetes mellitus during pregnancy, antepartum 11/25/2015  . Obesity in pregnancy 11/23/2015  . Pregnancy, supervision, high-risk, third trimester 11/23/2015    Past Surgical History:  Procedure Laterality Date  . TONSILLECTOMY     as a child     OB History    Gravida  1   Para  1   Term  1   Preterm  0   AB  0   Living  1     SAB  0   IAB  0   Ectopic  0   Multiple  0   Live Births  1           Family History  Problem Relation Age of Onset  . Diabetes Mother   . Heart disease Mother     Social History   Tobacco Use  . Smoking status: Current Some Day Smoker  . Smokeless tobacco: Never Used  . Tobacco comment: once in a while 1 cigarette  Vaping Use  . Vaping Use: Never used  Substance Use Topics  . Alcohol use: No  . Drug use: No    Home Medications Prior to Admission medications   Not on File     Allergies    Influenza vaccines  Review of Systems   Review of Systems  Constitutional: Negative for fever.  HENT: Negative for ear pain.   Eyes: Negative for pain.  Respiratory: Negative for cough.   Cardiovascular: Negative for chest pain.  Gastrointestinal: Negative for abdominal pain.  Genitourinary: Negative for flank pain.  Musculoskeletal: Negative for back pain.  Skin: Negative for rash.  Neurological: Negative for headaches.    Physical Exam Updated Vital Signs BP (!) 145/99 (BP Location: Right Arm)   Pulse 70   Temp 98.6 F (37 C) (Oral)   Resp 16   Ht 5\' 3"  (1.6 m)   Wt 130.2 kg   SpO2 98%   BMI 50.84 kg/m   Physical Exam Constitutional:      General: She is not in acute distress.    Appearance: Normal appearance.  HENT:     Head: Normocephalic.     Nose: Nose normal.  Eyes:     Extraocular Movements: Extraocular movements intact.  Cardiovascular:     Rate and Rhythm: Normal rate.  Pulmonary:  Effort: Pulmonary effort is normal.  Musculoskeletal:        General: Swelling and deformity present.     Cervical back: Normal range of motion.     Comments: Increased swelling and deformity to the right ankle lateral aspect tenderness palpation of the right lateral malleoli.  Otherwise dorsalis pedis and posterior tibialis pulses are intact 2+ compartments otherwise soft.  Neurological:     General: No focal deficit present.     Mental Status: She is alert. Mental status is at baseline.     ED Results / Procedures / Treatments   Labs (all labs ordered are listed, but only abnormal results are displayed) Labs Reviewed - No data to display  EKG None  Radiology DG Ankle Complete Right  Result Date: 08/28/2020 CLINICAL DATA:  Status post fall with right ankle pain. EXAM: RIGHT ANKLE - COMPLETE 3+ VIEW COMPARISON:  None. FINDINGS: There is no evidence of fracture, dislocation, or joint effusion. There is no evidence of arthropathy or other focal  bone abnormality. Soft tissue swelling over lateral ankle is noted. IMPRESSION: No acute fracture or dislocation. Soft tissue swelling over lateral ankle. Electronically Signed   By: Sherian Rein M.D.   On: 08/28/2020 15:27    Procedures Procedures   Medications Ordered in ED Medications  acetaminophen (TYLENOL) tablet 650 mg (650 mg Oral Given 08/28/20 1529)    ED Course  I have reviewed the triage vital signs and the nursing notes.  Pertinent labs & imaging results that were available during my care of the patient were reviewed by me and considered in my medical decision making (see chart for details).    MDM Rules/Calculators/A&P                          Three-view x-ray of the right ankle shows no acute fracture dislocation or foreign body.  Patient placed in an air splint.  We will recommend follow-up with orthopedic surgery within 3 to 5 days.  Recommend immediate return for worsening pain fevers or any additional concerns.  Addendum: Patient states that due to her body weight she is having difficulties using crutches.  I advised her she may benefit from a knee scooter or similar device if she is unable to use crutches such as a wheelchair.  Final Clinical Impression(s) / ED Diagnoses Final diagnoses:  Sprain of right ankle, unspecified ligament, initial encounter    Rx / DC Orders ED Discharge Orders    None       Cheryll Cockayne, MD 08/28/20 8295    Cheryll Cockayne, MD 08/28/20 1606

## 2020-08-28 NOTE — ED Triage Notes (Signed)
Fall in yard, twisted right ankle, good c/s, decreased movement due to pain

## 2020-08-28 NOTE — ED Notes (Signed)
Ortho tech contacted. 

## 2020-08-28 NOTE — ED Notes (Signed)
Spoke with ortho tech 

## 2021-12-01 ENCOUNTER — Emergency Department (HOSPITAL_COMMUNITY): Payer: Medicaid Other

## 2021-12-01 ENCOUNTER — Emergency Department (HOSPITAL_COMMUNITY)
Admission: EM | Admit: 2021-12-01 | Discharge: 2021-12-01 | Discharge status: Against Medical Advice | Payer: Medicaid Other | Attending: Student | Admitting: Student

## 2021-12-01 ENCOUNTER — Encounter (HOSPITAL_COMMUNITY): Payer: Self-pay | Admitting: Emergency Medicine

## 2021-12-01 ENCOUNTER — Other Ambulatory Visit: Payer: Self-pay

## 2021-12-01 DIAGNOSIS — R002 Palpitations: Secondary | ICD-10-CM | POA: Insufficient documentation

## 2021-12-01 DIAGNOSIS — I10 Essential (primary) hypertension: Secondary | ICD-10-CM | POA: Insufficient documentation

## 2021-12-01 DIAGNOSIS — Z87891 Personal history of nicotine dependence: Secondary | ICD-10-CM | POA: Diagnosis not present

## 2021-12-01 DIAGNOSIS — R42 Dizziness and giddiness: Secondary | ICD-10-CM | POA: Diagnosis not present

## 2021-12-01 DIAGNOSIS — Z5321 Procedure and treatment not carried out due to patient leaving prior to being seen by health care provider: Secondary | ICD-10-CM | POA: Diagnosis not present

## 2021-12-01 DIAGNOSIS — E119 Type 2 diabetes mellitus without complications: Secondary | ICD-10-CM | POA: Diagnosis not present

## 2021-12-01 LAB — CBC WITH DIFFERENTIAL/PLATELET
Abs Immature Granulocytes: 0.02 10*3/uL (ref 0.00–0.07)
Basophils Absolute: 0.1 10*3/uL (ref 0.0–0.1)
Basophils Relative: 1 %
Eosinophils Absolute: 0.1 10*3/uL (ref 0.0–0.5)
Eosinophils Relative: 2 %
HCT: 39.2 % (ref 36.0–46.0)
Hemoglobin: 12.6 g/dL (ref 12.0–15.0)
Immature Granulocytes: 0 %
Lymphocytes Relative: 36 %
Lymphs Abs: 3.3 10*3/uL (ref 0.7–4.0)
MCH: 25.9 pg — ABNORMAL LOW (ref 26.0–34.0)
MCHC: 32.1 g/dL (ref 30.0–36.0)
MCV: 80.5 fL (ref 80.0–100.0)
Monocytes Absolute: 0.5 10*3/uL (ref 0.1–1.0)
Monocytes Relative: 6 %
Neutro Abs: 5.3 10*3/uL (ref 1.7–7.7)
Neutrophils Relative %: 55 %
Platelets: 299 10*3/uL (ref 150–400)
RBC: 4.87 MIL/uL (ref 3.87–5.11)
RDW: 14.3 % (ref 11.5–15.5)
WBC: 9.4 10*3/uL (ref 4.0–10.5)
nRBC: 0 % (ref 0.0–0.2)

## 2021-12-01 LAB — BASIC METABOLIC PANEL
Anion gap: 10 (ref 5–15)
BUN: 13 mg/dL (ref 6–20)
CO2: 21 mmol/L — ABNORMAL LOW (ref 22–32)
Calcium: 9 mg/dL (ref 8.9–10.3)
Chloride: 105 mmol/L (ref 98–111)
Creatinine, Ser: 0.65 mg/dL (ref 0.44–1.00)
GFR, Estimated: >60 mL/min (ref >60)
Glucose, Bld: 139 mg/dL — ABNORMAL HIGH (ref 70–99)
Potassium: 3.8 mmol/L (ref 3.5–5.1)
Sodium: 136 mmol/L (ref 135–145)

## 2021-12-01 LAB — I-STAT BETA HCG BLOOD, ED (MC, WL, AP ONLY): I-stat hCG, quantitative: <5 m[IU]/mL (ref <5)

## 2021-12-01 LAB — TSH: TSH: 1.803 u[IU]/mL (ref 0.350–4.500)

## 2021-12-01 NOTE — ED Triage Notes (Signed)
Patient here with complaint of dizziness and feeling like her heart is racing for the last three days. Reports experiencing similiar episodes in the past that were believed to be related to her blood pressure. Patient is alert, oriented, ambulatory, and in no apparent distress at this time.

## 2021-12-01 NOTE — ED Provider Triage Note (Signed)
Emergency Medicine Provider Triage Evaluation Note  Cheryl Boyle , a 36 y.o. female  was evaluated in triage.  Pt complains of lightheadedness and palpitations for the past 3 days.  She says that this is happened in the past and it was due to her diabetes.  No recent travel, surgery, history of DVT/PE, tobacco use or OCP use.  PMH diabetes and hypertension.  Does not check blood sugar at home  Review of Systems  Positive: Palpitations lightheadedness Negative: Chest pain or shortness of breath  Physical Exam  BP 134/84 (BP Location: Right Arm)   Pulse 82   Temp 98.3 F (36.8 C) (Oral)   Resp 20   SpO2 98%  Gen:   Awake, no distress   Resp:  Normal effort  MSK:   Moves extremities without difficulty  Other:  Heart rate within normal limits, RRR, lung sounds clear  Medical Decision Making  Medically screening exam initiated at 11:51 AM.  Appropriate orders placed.  Cheryl Boyle was informed that the remainder of the evaluation will be completed by another provider, this initial triage assessment does not replace that evaluation, and the importance of remaining in the ED until their evaluation is complete.     Saddie Benders, PA-C 12/01/21 1151

## 2021-12-01 NOTE — ED Notes (Signed)
Called pt x3 for room, no response. 

## 2023-08-06 ENCOUNTER — Encounter (HOSPITAL_BASED_OUTPATIENT_CLINIC_OR_DEPARTMENT_OTHER): Payer: Self-pay | Admitting: *Deleted

## 2023-08-06 ENCOUNTER — Other Ambulatory Visit: Payer: Self-pay

## 2023-08-06 ENCOUNTER — Emergency Department (HOSPITAL_BASED_OUTPATIENT_CLINIC_OR_DEPARTMENT_OTHER)
Admission: EM | Admit: 2023-08-06 | Discharge: 2023-08-06 | Disposition: A | Discharge status: Home / Self Care | Attending: Emergency Medicine | Admitting: Emergency Medicine

## 2023-08-06 DIAGNOSIS — R42 Dizziness and giddiness: Secondary | ICD-10-CM | POA: Diagnosis present

## 2023-08-06 DIAGNOSIS — N946 Dysmenorrhea, unspecified: Secondary | ICD-10-CM | POA: Diagnosis not present

## 2023-08-06 DIAGNOSIS — Z7984 Long term (current) use of oral hypoglycemic drugs: Secondary | ICD-10-CM | POA: Insufficient documentation

## 2023-08-06 DIAGNOSIS — F17210 Nicotine dependence, cigarettes, uncomplicated: Secondary | ICD-10-CM | POA: Diagnosis not present

## 2023-08-06 DIAGNOSIS — E119 Type 2 diabetes mellitus without complications: Secondary | ICD-10-CM | POA: Diagnosis not present

## 2023-08-06 DIAGNOSIS — M542 Cervicalgia: Secondary | ICD-10-CM | POA: Diagnosis not present

## 2023-08-06 LAB — BASIC METABOLIC PANEL
Anion gap: 9 (ref 5–15)
BUN: 14 mg/dL (ref 6–20)
CO2: 22 mmol/L (ref 22–32)
Calcium: 8.9 mg/dL (ref 8.9–10.3)
Chloride: 105 mmol/L (ref 98–111)
Creatinine, Ser: 0.6 mg/dL (ref 0.44–1.00)
GFR, Estimated: >60 mL/min (ref >60)
Glucose, Bld: 219 mg/dL — ABNORMAL HIGH (ref 70–99)
Potassium: 3.7 mmol/L (ref 3.5–5.1)
Sodium: 136 mmol/L (ref 135–145)

## 2023-08-06 LAB — CBC
HCT: 40 % (ref 36.0–46.0)
Hemoglobin: 12.8 g/dL (ref 12.0–15.0)
MCH: 26.6 pg (ref 26.0–34.0)
MCHC: 32 g/dL (ref 30.0–36.0)
MCV: 83.2 fL (ref 80.0–100.0)
Platelets: 259 10*3/uL (ref 150–400)
RBC: 4.81 MIL/uL (ref 3.87–5.11)
RDW: 13.4 % (ref 11.5–15.5)
WBC: 9 10*3/uL (ref 4.0–10.5)
nRBC: 0 % (ref 0.0–0.2)

## 2023-08-06 LAB — URINALYSIS, ROUTINE W REFLEX MICROSCOPIC
Bilirubin Urine: NEGATIVE
Glucose, UA: 100 mg/dL — AB
Hgb urine dipstick: NEGATIVE
Nitrite: NEGATIVE
Specific Gravity, Urine: 1.031 — ABNORMAL HIGH (ref 1.005–1.030)
pH: 5.5 (ref 5.0–8.0)

## 2023-08-06 LAB — PREGNANCY, URINE: Preg Test, Ur: NEGATIVE

## 2023-08-06 MED ORDER — METFORMIN HCL 1000 MG PO TABS: 1000.0000 mg | ORAL_TABLET | Freq: Two times a day (BID) | ORAL | 1 refills | Status: AC

## 2023-08-06 MED ORDER — BLOOD GLUCOSE TEST VI STRP: 1.0000 | ORAL_STRIP | Freq: Three times a day (TID) | 0 refills | Status: AC

## 2023-08-06 MED ORDER — CEFADROXIL 500 MG PO CAPS: 500.0000 mg | ORAL_CAPSULE | Freq: Two times a day (BID) | ORAL | 0 refills | Status: AC

## 2023-08-06 MED ORDER — IBUPROFEN 600 MG PO TABS: 600.0000 mg | ORAL_TABLET | Freq: Four times a day (QID) | ORAL | 0 refills | Status: AC | PRN

## 2023-08-06 MED ORDER — ACCU-CHEK SOFTCLIX LANCETS MISC: 12 refills | Status: AC

## 2023-08-06 MED ORDER — AMPHETAMINE-DEXTROAMPHETAMINE 10 MG PO TABS: 10.0000 mg | ORAL_TABLET | Freq: Two times a day (BID) | ORAL | 0 refills | Status: AC

## 2023-08-06 NOTE — ED Triage Notes (Addendum)
 Patient reports left sided neck pain radiating to her left shoulder and arm x 3 months with dizziness when eating and bilateral flank pain that worsens before menstrual cycle. No changes reported to periods or bleeding. No urinary changes. Patient also reporting intermittent left sided headaches at work x 3 months.   Patient also reporting for the past 2 weeks her hands have been shaking intermittently at work.

## 2023-08-06 NOTE — Discharge Instructions (Addendum)
 As discussed, will place you on anti-inflammatory in the form of ibuprofen to take when you develop left-sided neck pain as well as menstrual pain.  Will also refill your metformin that you were on as well as your ADHD medication.  I have sent a referral for primary care and attached their information to your discharge papers.  Please call to establish care.  Recommend continued oral hydration.  Will also placed on antibiotics given concern for possible urine infection.  Please do not hesitate to return if the worrisome signs and symptoms we discussed to become apparent.

## 2023-08-06 NOTE — ED Provider Notes (Signed)
 [place] EMERGENCY DEPARTMENT AT [facility] Provider Note   CSN: [id number] Arrival date & time: 08/06/23  1348     History  Chief Complaint  Patient presents with   Neck Pain   Flank Pain   Dizziness    Cheryl Boyle is a 38 y.o. female.   Neck Pain Flank Pain  Dizziness   38 year old female presents emergency department with 3 different complaints.  Reports bilateral low back pain worsened on the onset of her menstrual cycle.  States that symptoms are relieved once her menstrual cycle is over.  States that she is currently without any pain as she is not on her menstrual cycle.  Does not take any medications for this.  Symptoms present for the past 3 to 4 months.  Also reporting pain in the left side of her neck with radiation to her left shoulder.  Patient states that she works at a [employer] and is constantly "hunched over."  States that symptoms are worsened whenever she is at work and relieved when she is not.  Currently without any symptoms.  Denies any weakness or sensory deficits in hands.  Denies any trauma to neck.  Patient additionally reporting lightheadedness.  Describes lightheadedness as feeling like she is going to pass out.  States that symptoms are isolated to getting up from a sitting down laying down position.  States that these also occur after work typically.  She states that while at work, does not hydrate very much during the day.  Denies any chest pain, shortness of breath or exertional worsening of symptoms.  Patient also declining current symptoms at this time.  Past medical history significant for diabetes mellitus, ADHD  Home Medications Prior to Admission medications   Medication Sig Start Date End Date Taking? Authorizing Provider  Accu-Chek Softclix Lancets lancets Use as instructed 08/06/23  Yes Sherian Maroon A, PA  amphetamine-dextroamphetamine (ADDERALL) 10 MG tablet Take 1 tablet (10 mg total) by mouth 2 (two) times daily with a meal.  08/06/23  Yes Sherian Maroon A, PA  cefadroxil (DURICEF) 500 MG capsule Take 1 capsule (500 mg total) by mouth 2 (two) times daily. 08/06/23  Yes Sherian Maroon A, PA  Glucose Blood (BLOOD GLUCOSE TEST STRIPS) STRP 1 each by In Vitro route 3 (three) times daily with meals. May substitute to any manufacturer covered by patient's insurance. 08/06/23  Yes Sherian Maroon A, PA  ibuprofen (ADVIL) 600 MG tablet Take 1 tablet (600 mg total) by mouth every 6 (six) hours as needed. 08/06/23  Yes Sherian Maroon A, PA  metFORMIN (GLUCOPHAGE) 1000 MG tablet Take 1 tablet (1,000 mg total) by mouth 2 (two) times daily. 08/06/23  Yes Sherian Maroon A, PA      Allergies    Influenza vaccines    Review of Systems   Review of Systems  Genitourinary:  Positive for flank pain.  Musculoskeletal:  Positive for neck pain.  Neurological:  Positive for dizziness.  All other systems reviewed and are negative.   Physical Exam Updated Vital Signs BP 128/74 (BP Location: Right Arm)   Pulse 88   Temp 98.7 F (37.1 C) (Oral)   Resp 16   Ht 5\' 3"  (1.6 m)   Wt 130.2 kg   LMP 07/13/2023   SpO2 98%   BMI 50.84 kg/m  Physical Exam Vitals and nursing note reviewed.  Constitutional:      General: She is not in acute distress.    Appearance: She is well-developed.  HENT:  Head: Normocephalic and atraumatic.  Eyes:     Conjunctiva/sclera: Conjunctivae normal.  Cardiovascular:     Rate and Rhythm: Normal rate and regular rhythm.     Heart sounds: No murmur heard. Pulmonary:     Effort: Pulmonary effort is normal. No respiratory distress.     Breath sounds: Normal breath sounds.  Abdominal:     Palpations: Abdomen is soft.     Tenderness: There is no abdominal tenderness. There is no right CVA tenderness, left CVA tenderness or guarding.  Musculoskeletal:        General: No swelling.     Cervical back: Neck supple.     Comments: No midline tenderness cervical, thoracic, lumbar spine without step-off or  deformity.  No paraspinal tenderness noted throughout spine.  Muscular strength out of 5 bilateral upper lower extremities without overlying sensory deficits.  Radial pedal pulses 2+ bilaterally.  Skin:    General: Skin is warm and dry.     Capillary Refill: Capillary refill takes less than 2 seconds.  Neurological:     Mental Status: She is alert.  Psychiatric:        Mood and Affect: Mood normal.     ED Results / Procedures / Treatments   Labs (all labs ordered are listed, but only abnormal results are displayed) Labs Reviewed  BASIC METABOLIC PANEL - Abnormal; Notable for the following components:      Result Value   Glucose, Bld 219 (*)    All other components within normal limits  URINALYSIS, ROUTINE W REFLEX MICROSCOPIC - Abnormal; Notable for the following components:   APPearance HAZY (*)    Specific Gravity, Urine 1.031 (*)    Glucose, UA 100 (*)    Ketones, ur TRACE (*)    Protein, ur TRACE (*)    Leukocytes,Ua LARGE (*)    Bacteria, UA RARE (*)    All other components within normal limits  CBC  PREGNANCY, URINE    EKG EKG Interpretation Date/Time:  Tuesday August 06 2023 13:57:16 EDT Ventricular Rate:  89 PR Interval:  132 QRS Duration:  74 QT Interval:  358 QTC Calculation: 435 R Axis:   23  Text Interpretation: Normal sinus rhythm Nonspecific T wave abnormality Abnormal ECG No significant change since last tracing Confirmed by Melene Plan [phone]) on 08/06/2023 3:10:56 PM  Radiology No results found.  Procedures Procedures    Medications Ordered in ED Medications - No data to display  ED Course/ Medical Decision Making/ A&P                                 Medical Decision Making Amount and/or Complexity of Data Reviewed Labs: ordered.  Risk OTC drugs. Prescription drug management.   This patient presents to the ED for concern of left neck/arm pain, menstrual pain, lightheadedness, this involves an extensive number of treatment options, and  is a complaint that carries with it a high risk of complications and morbidity.  The differential diagnosis includes arrhythmia, dehydration, ACS, PE, medication side effect, cervical radiculopathy, ischemic limb, DVT, fracture, dislocation, spinal cord compression/impingement, other   Co morbidities that complicate the patient evaluation  See HPI   Additional history obtained:  Additional history obtained from EMR External records from outside source obtained and reviewed including hospital records   Lab Tests:  I Ordered, and personally interpreted labs.  The pertinent results include: No leukocytosis.  No evidence of anemia.  Platelets within range.  No Electra abnormalities.  No renal dysfunction.  UA contaminated sample with 11-20 squamous epithelial cells but with large leukocytes, 21-50 RBCs with 3 bacteria.  Ketones and proteins present in urine.   Imaging Studies ordered:  N/a   Cardiac Monitoring: / EKG:  The patient was maintained on a cardiac monitor.  I personally viewed and interpreted the cardiac monitored which showed an underlying rhythm of: sinus rhythm   Consultations Obtained:  N/a   Problem List / ED Course / Critical interventions / Medication management  Positional lightheadedness, menstrual pain, left neck/arm pain Reevaluation of the patient showed that the patient stayed the same I have reviewed the patients home medicines and have made adjustments as needed   Social Determinants of Health:  Cigarette use.  Denies illicit drug use.   Test / Admission - Considered:  Positional lightheadedness, menstrual pain, left neck/arm pain Vitals signs within normal range and stable throughout visit. Laboratory studies significant for: see above 38 year old female presents emergency department with 3 different complaints.  Reports bilateral low back pain worsened on the onset of her menstrual cycle.  States that symptoms are relieved once her menstrual  cycle is over.  States that she is currently without any pain as she is not on her menstrual cycle.  Does not take any medications for this.  Symptoms present for the past 3 to 4 months.  Also reporting pain in the left side of her neck with radiation to her left shoulder.  Patient states that she works at a [employer] and is constantly "hunched over."  States that symptoms are worsened whenever she is at work and relieved when she is not.  Currently without any symptoms.  Denies any weakness or sensory deficits in hands.  Denies any trauma to neck.  Patient additionally reporting lightheadedness.  Describes lightheadedness as feeling like she is going to pass out.  States that symptoms are isolated to getting up from a sitting down laying down position.  States that these also occur after work typically.  She states that while at work, does not hydrate very much during the day.  Denies any chest pain, shortness of breath or exertional worsening of symptoms.  Patient also declining current symptoms at this time. Physical exam relatively benign.  Patient without complaints of any back pain, left-sided neck/arm pain currently.  Symptoms seem to be more chronic.  Regarding low back pain, seems to be related to menstrual cycle and not experienced otherwise.  Recommend use of NSAIDs at home for treatment of this pain.  Regarding left neck pain with radiation to left shoulder, patient currently asymptomatic.  Seems to be worsened when she is at work at a [employer].  What is described seems more muscular in nature with possible cervical radiculopathy.  Given the patient is currently asymptomatic, will refrain from imaging at this time.  Will trial NSAIDs when symptoms become present.  Regarding positional lightheadedness, seem to be isolated to getting up from a seated down/laying position.  Currently asymptomatic.  States that this typically occurs after long days work when she has not had very much to drink.  EKG  without obvious arrhythmia.  Labs relatively unremarkable for any acute process.  Suspect patient could be experiencing orthostatic hypotension from decreased p.o. intake.  Recommend adequate oral hydration especially when she was at work.  Patient states that she was dismissed from primary care through [facility] due to not showing up to appointments.  States that she  was going through "a nasty divorce" and lost physical ability to make it to appointments.  States that she is in a much better life circumstance currently and is seeking to establish care with primary care closer to her [place] in [place].  Will send a referral for primary care and refill her metformin and other medications she was on from prior primary care. Worrisome signs and symptoms were discussed with the patient, and the patient acknowledged understanding to return to the ED if noticed. Patient was stable upon discharge.          Final Clinical Impression(s) / ED Diagnoses Final diagnoses:  Positional lightheadedness  Neck pain on left side  Menstrual pain    Rx / DC Orders      Peter Garter, PA 08/06/23 1633    Melene Plan, DO 08/06/23 1826

## 2024-01-09 ENCOUNTER — Emergency Department (HOSPITAL_BASED_OUTPATIENT_CLINIC_OR_DEPARTMENT_OTHER)

## 2024-01-09 ENCOUNTER — Emergency Department (HOSPITAL_BASED_OUTPATIENT_CLINIC_OR_DEPARTMENT_OTHER): Admitting: Radiology

## 2024-01-09 ENCOUNTER — Emergency Department (HOSPITAL_BASED_OUTPATIENT_CLINIC_OR_DEPARTMENT_OTHER)
Admission: EM | Admit: 2024-01-09 | Discharge: 2024-01-09 | Disposition: A | Discharge status: Home / Self Care | Attending: Emergency Medicine | Admitting: Emergency Medicine

## 2024-01-09 ENCOUNTER — Other Ambulatory Visit: Payer: Self-pay

## 2024-01-09 DIAGNOSIS — E876 Hypokalemia: Secondary | ICD-10-CM | POA: Diagnosis not present

## 2024-01-09 DIAGNOSIS — R739 Hyperglycemia, unspecified: Secondary | ICD-10-CM | POA: Insufficient documentation

## 2024-01-09 DIAGNOSIS — R0789 Other chest pain: Secondary | ICD-10-CM

## 2024-01-09 DIAGNOSIS — R079 Chest pain, unspecified: Secondary | ICD-10-CM | POA: Diagnosis present

## 2024-01-09 DIAGNOSIS — R0602 Shortness of breath: Secondary | ICD-10-CM | POA: Diagnosis not present

## 2024-01-09 LAB — CBC WITH DIFFERENTIAL/PLATELET
Abs Immature Granulocytes: 0.02 K/uL (ref 0.00–0.07)
Basophils Absolute: 0.1 K/uL (ref 0.0–0.1)
Basophils Relative: 1 %
Eosinophils Absolute: 0.1 K/uL (ref 0.0–0.5)
Eosinophils Relative: 2 %
HCT: 37.9 % (ref 36.0–46.0)
Hemoglobin: 12.2 g/dL (ref 12.0–15.0)
Immature Granulocytes: 0 %
Lymphocytes Relative: 32 %
Lymphs Abs: 2.9 K/uL (ref 0.7–4.0)
MCH: 26.8 pg (ref 26.0–34.0)
MCHC: 32.2 g/dL (ref 30.0–36.0)
MCV: 83.1 fL (ref 80.0–100.0)
Monocytes Absolute: 0.5 K/uL (ref 0.1–1.0)
Monocytes Relative: 5 %
Neutro Abs: 5.3 K/uL (ref 1.7–7.7)
Neutrophils Relative %: 60 %
Platelets: 291 K/uL (ref 150–400)
RBC: 4.56 MIL/uL (ref 3.87–5.11)
RDW: 13.6 % (ref 11.5–15.5)
WBC: 8.8 K/uL (ref 4.0–10.5)
nRBC: 0 % (ref 0.0–0.2)

## 2024-01-09 LAB — COMPREHENSIVE METABOLIC PANEL WITH GFR
ALT: 10 U/L (ref 0–44)
AST: 14 U/L — ABNORMAL LOW (ref 15–41)
Albumin: 4 g/dL (ref 3.5–5.0)
Alkaline Phosphatase: 120 U/L (ref 38–126)
Anion gap: 11 (ref 5–15)
BUN: 15 mg/dL (ref 6–20)
CO2: 23 mmol/L (ref 22–32)
Calcium: 9.4 mg/dL (ref 8.9–10.3)
Chloride: 102 mmol/L (ref 98–111)
Creatinine, Ser: 0.76 mg/dL (ref 0.44–1.00)
GFR, Estimated: >60 mL/min (ref >60)
Glucose, Bld: 223 mg/dL — ABNORMAL HIGH (ref 70–99)
Potassium: 3.4 mmol/L — ABNORMAL LOW (ref 3.5–5.1)
Sodium: 136 mmol/L (ref 135–145)
Total Bilirubin: 0.2 mg/dL (ref 0.0–1.2)
Total Protein: 6.9 g/dL (ref 6.5–8.1)

## 2024-01-09 LAB — URINALYSIS, ROUTINE W REFLEX MICROSCOPIC
Bacteria, UA: NONE SEEN
Bilirubin Urine: NEGATIVE
Glucose, UA: 500 mg/dL — AB
Leukocytes,Ua: NEGATIVE
Nitrite: NEGATIVE
Protein, ur: 30 mg/dL — AB
Specific Gravity, Urine: 1.035 — ABNORMAL HIGH (ref 1.005–1.030)
pH: 5.5 (ref 5.0–8.0)

## 2024-01-09 LAB — TROPONIN T, HIGH SENSITIVITY
Troponin T High Sensitivity: <15 ng/L (ref 0–19)
Troponin T High Sensitivity: <15 ng/L (ref 0–19)

## 2024-01-09 LAB — D-DIMER, QUANTITATIVE: D-Dimer, Quant: 0.35 ug{FEU}/mL (ref 0.00–0.50)

## 2024-01-09 LAB — CBG MONITORING, ED: Glucose-Capillary: 200 mg/dL — ABNORMAL HIGH (ref 70–99)

## 2024-01-09 LAB — PREGNANCY, URINE: Preg Test, Ur: NEGATIVE

## 2024-01-09 MED ORDER — NAPROXEN 500 MG PO TABS: 500.0000 mg | ORAL_TABLET | Freq: Two times a day (BID) | ORAL | 0 refills | Status: AC

## 2024-01-09 MED ORDER — POTASSIUM CHLORIDE CRYS ER 20 MEQ PO TBCR
40.0000 meq | EXTENDED_RELEASE_TABLET | Freq: Once | ORAL | Status: AC
  Administered 2024-01-09: 40 meq via ORAL
  Filled 2024-01-09: qty 2

## 2024-01-09 MED ORDER — IOHEXOL 300 MG/ML  SOLN
100.0000 mL | Freq: Once | INTRAMUSCULAR | Status: AC | PRN
  Administered 2024-01-09: 100 mL via INTRAVENOUS

## 2024-01-09 MED ORDER — KETOROLAC TROMETHAMINE 15 MG/ML IJ SOLN
15.0000 mg | Freq: Once | INTRAMUSCULAR | Status: AC
  Administered 2024-01-09: 15 mg via INTRAVENOUS
  Filled 2024-01-09: qty 1

## 2024-01-09 MED ORDER — SODIUM CHLORIDE 0.9 % IV BOLUS
1000.0000 mL | Freq: Once | INTRAVENOUS | Status: AC
  Administered 2024-01-09: 1000 mL via INTRAVENOUS

## 2024-01-09 NOTE — Discharge Instructions (Signed)
 Please follow-up closely with your primary care doctor on an outpatient basis.  Return to emergency department immediately for any new or worsening symptoms.  Please continue to monitor your blood sugar at home.

## 2024-01-09 NOTE — ED Provider Notes (Signed)
 [place] EMERGENCY DEPARTMENT AT [facility] Provider Note   CSN: [id number] Arrival date & time: 01/09/24  1728     Patient presents with: Shortness of Breath   Cheryl Boyle is a 38 y.o. female.   Patient is a 38 year old female who presents emergency department with a chief complaint of chest pain, shortness of breath, pain to bilateral extremities and pain to the left forearm.  Patient notes that she has been experiencing the pain to her lower extremities and forearm for quite some time now.  She notes that she has seen her primary care doctor for this.  She notes that the chest pain and shortness of breath has developed over the past 3 days.  She notes that she has had some mild edema to her lower extremities.  She does admit to some intermittent back pain as well.  She denies any urinary bowel incontinence, saddle paresthesias, gait changes.  She has had no fever or chills.  She denies any recent cough, congestion, rhinorrhea or sore throat.  She denies any history of known cardiac or pulmonary disease.   Shortness of Breath Associated symptoms: chest pain        Prior to Admission medications   Medication Sig Start Date End Date Taking? Authorizing Provider  Accu-Chek Softclix Lancets lancets Use as instructed 08/06/23   Silver Fell A, PA  amphetamine -dextroamphetamine  (ADDERALL) 10 MG tablet Take 1 tablet (10 mg total) by mouth 2 (two) times daily with a meal. 08/06/23   Silver Fell A, PA  cefadroxil  (DURICEF) 500 MG capsule Take 1 capsule (500 mg total) by mouth 2 (two) times daily. 08/06/23   Silver Fell A, PA  Glucose Blood (BLOOD GLUCOSE TEST STRIPS) STRP 1 each by In Vitro route 3 (three) times daily with meals. May substitute to any manufacturer covered by patient's insurance. 08/06/23   Silver Fell LABOR, PA  ibuprofen  (ADVIL ) 600 MG tablet Take 1 tablet (600 mg total) by mouth every 6 (six) hours as needed. 08/06/23   Silver Fell LABOR, PA  metFORMIN   (GLUCOPHAGE ) 1000 MG tablet Take 1 tablet (1,000 mg total) by mouth 2 (two) times daily. 08/06/23   Silver Fell LABOR, PA    Allergies: Influenza vaccines    Review of Systems  Respiratory:  Positive for shortness of breath.   Cardiovascular:  Positive for chest pain.  Musculoskeletal:  Positive for myalgias.    Updated Vital Signs BP (!) 144/125   Pulse 72   Temp 98.4 F (36.9 C) (Oral)   Resp 16   SpO2 100%   Physical Exam Vitals and nursing note reviewed.  Constitutional:      General: She is not in acute distress.    Appearance: Normal appearance. She is not ill-appearing.  HENT:     Head: Normocephalic and atraumatic.     Nose: Nose normal.     Mouth/Throat:     Mouth: Mucous membranes are moist.  Eyes:     Extraocular Movements: Extraocular movements intact.     Conjunctiva/sclera: Conjunctivae normal.     Pupils: Pupils are equal, round, and reactive to light.  Cardiovascular:     Rate and Rhythm: Normal rate and regular rhythm.     Pulses: Normal pulses.     Heart sounds: Normal heart sounds. No murmur heard.    No gallop.  Pulmonary:     Effort: Pulmonary effort is normal. No tachypnea.     Breath sounds: Normal breath sounds. No stridor. No decreased breath sounds,  wheezing, rhonchi or rales.  Chest:     Chest wall: No mass, tenderness or edema.  Abdominal:     General: Abdomen is flat. Bowel sounds are normal.     Palpations: Abdomen is soft.     Tenderness: There is no abdominal tenderness. There is no guarding.  Musculoskeletal:        General: Normal range of motion.     Cervical back: Normal range of motion and neck supple.     Right lower leg: No tenderness. No edema.     Left lower leg: No tenderness. No edema.     Comments: 2+ DP and PT pulses distally in bilateral lower extremities, no overlying erythema, warmth or edema  Skin:    General: Skin is warm and dry.     Findings: No ecchymosis, erythema or rash.  Neurological:     General: No  focal deficit present.     Mental Status: She is alert and oriented to person, place, and time. Mental status is at baseline.     Cranial Nerves: No cranial nerve deficit.     Motor: No weakness.  Psychiatric:        Mood and Affect: Mood normal.        Behavior: Behavior normal.        Thought Content: Thought content normal.        Judgment: Judgment normal.     (all labs ordered are listed, but only abnormal results are displayed) Labs Reviewed  CBG MONITORING, ED - Abnormal; Notable for the following components:      Result Value   Glucose-Capillary 200 (*)    All other components within normal limits  CBC WITH DIFFERENTIAL/PLATELET  D-DIMER, QUANTITATIVE  COMPREHENSIVE METABOLIC PANEL WITH GFR  HCG, SERUM, QUALITATIVE  TROPONIN T, HIGH SENSITIVITY    EKG: None  Radiology: No results found.   Procedures   Medications Ordered in the ED  sodium chloride  0.9 % bolus 1,000 mL (has no administration in time range)  ketorolac  (TORADOL ) 15 MG/ML injection 15 mg (has no administration in time range)                                    Medical Decision Making Amount and/or Complexity of Data Reviewed Labs: ordered. Radiology: ordered.  Risk Prescription drug management.   This patient presents to the ED for concern of chest pain, shortness of breath differential diagnosis includes ACS, pulmonary embolus, pericarditis, myocarditis, endocarditis, pneumonia, pneumothorax, hemothorax, aortic aneurysm or dissection, musculoskeletal pain, CHF    Additional history obtained:  Additional history obtained from none External records from outside source obtained and reviewed including medical records   Lab Tests:  I Ordered, and personally interpreted labs.  The pertinent results include: No leukocytosis, no anemia, mild hypokalemia, hyperglycemia, normal kidney function liver function, negative serial troponins, negative D-dimer, unremarkable urinalysis   Imaging  Studies ordered:  I ordered imaging studies including chest x-ray, CT scan abdomen and pelvis I independently visualized and interpreted imaging which showed no acute cardiopulmonary process, no acute intra-abdominal process I agree with the radiologist interpretation   Medicines ordered and prescription drug management:  I ordered medication including Toradol , IV fluids, potassium for hyperglycemia and hypokalemia Reevaluation of the patient after these medicines showed that the patient improved I have reviewed the patients home medicines and have made adjustments as needed   Problem List / ED Course:  Patient notes that symptoms have greatly improved with treatment in the emergency department.  Workup has been overall unremarkable.  Do not suspect ACS at this time as patient has EKG with no acute ischemic changes and negative serial troponins.  Patient has a negative D-dimer I do not suspect pulmonary embolus.  Patient has no clinical indication for endocarditis aortic aneurysm or dissection.  Chest x-ray demonstrate no indication for pneumonia, pneumothorax, hemothorax.  Symptoms are not positional in nature and do not suspect peritonsillar myocarditis.  CT scan of the abdomen and pelvis was obtained as patient did have some epigastric discomfort which was unremarkable.  Will continue symptomatic treatment outpatient basis recommend close follow-up with her primary care doctor.  Strict turn precautions were discussed for any new or worsening symptoms.  Patient voiced understanding and had no additional questions.   Social Determinants of Health:  None        Final diagnoses:  None    ED Discharge Orders     None          Daralene Lonni JONETTA DEVONNA 01/09/24 2026    Armenta Canning, MD 01/20/24 [phone]

## 2024-01-09 NOTE — ED Triage Notes (Signed)
 Patient states shortness of breath and fatigue for 3 days. Also reports bilateral leg pain for a month.
